# Patient Record
Sex: Female | Born: 1980 | Race: White | Hispanic: No | Marital: Single
Health system: Midwestern US, Community
[De-identification: ages and names within clinical notes are randomized; demographics above are authoritative.]

## PROBLEM LIST (undated history)

## (undated) DIAGNOSIS — I10 Essential (primary) hypertension: Secondary | ICD-10-CM

## (undated) DIAGNOSIS — J45909 Unspecified asthma, uncomplicated: Secondary | ICD-10-CM

## (undated) DIAGNOSIS — M359 Systemic involvement of connective tissue, unspecified: Secondary | ICD-10-CM

## (undated) DIAGNOSIS — F32A Depression, unspecified: Secondary | ICD-10-CM

## (undated) DIAGNOSIS — K219 Gastro-esophageal reflux disease without esophagitis: Secondary | ICD-10-CM

## (undated) DIAGNOSIS — M069 Rheumatoid arthritis, unspecified: Secondary | ICD-10-CM

## (undated) DIAGNOSIS — M1990 Unspecified osteoarthritis, unspecified site: Secondary | ICD-10-CM

## (undated) DIAGNOSIS — F329 Major depressive disorder, single episode, unspecified: Secondary | ICD-10-CM

## (undated) DIAGNOSIS — T7840XA Allergy, unspecified, initial encounter: Secondary | ICD-10-CM

## (undated) DIAGNOSIS — G709 Myoneural disorder, unspecified: Secondary | ICD-10-CM

## (undated) HISTORY — DX: Rheumatoid arthritis, unspecified: M06.9

## (undated) HISTORY — DX: Gastro-esophageal reflux disease without esophagitis: K21.9

## (undated) HISTORY — DX: Unspecified asthma, uncomplicated: J45.909

## (undated) HISTORY — DX: Major depressive disorder, single episode, unspecified: F32.9

## (undated) HISTORY — DX: Myoneural disorder, unspecified: G70.9

## (undated) HISTORY — DX: Allergy, unspecified, initial encounter: T78.40XA

## (undated) HISTORY — DX: Essential (primary) hypertension: I10

## (undated) HISTORY — DX: Unspecified osteoarthritis, unspecified site: M19.90

## (undated) HISTORY — DX: Depression, unspecified: F32.A

---

## 2008-12-14 DIAGNOSIS — G709 Myoneural disorder, unspecified: Secondary | ICD-10-CM

## 2008-12-14 HISTORY — DX: Myoneural disorder, unspecified: G70.9

## 2015-01-07 NOTE — Telephone Encounter (Signed)
S: Pt is calling [facility] with constant left pelvic pain.  B: Pt has had symptoms x 4 days  A: Pt reports that she has had sudden onset of moderate to severe left pelvic pain that has been constant x 4 days. She states that sitting and bending aggravates that pain. Laying flat and pain meds have given her temporary relief. Pt denies having vomiting, constipation, diarrhea, urinary symptoms. Pt is complaining of occasional nausea. Pt states that the pain worsens when her bladder is full or after she has eaten a meal. Pt states that she does not have periods since she has had an implant.   R:DISPOSITION: See Physician within 4 Hours (or Physician triage) - [1] MILD-MODERATE pain AND [2] constant AND [3] present > 2 hours. Dr Risa GrillShondel paged and notified of pt symptoms and pt refusal to go to ER at this time. Pt requesting an appt for tomorrow. Appt made for 1/26 @ 11:00 AM [facility] office. Pt advised to call back or seek medical assistance if her symptoms worsen.

## 2015-01-08 ENCOUNTER — Ambulatory Visit
Admit: 2015-01-08 | Discharge: 2015-01-08 | Payer: BLUE CROSS/BLUE SHIELD | Attending: Advanced Practice Midwife | Primary: Internal Medicine

## 2015-01-08 DIAGNOSIS — R102 Pelvic and perineal pain: Secondary | ICD-10-CM

## 2015-01-08 NOTE — Progress Notes (Signed)
Amanda DankerBarbara A [name]  01/08/2015    Date Of Birth:  12/16/1980      Chief Complaint   Patient presents with   ??? Pelvic Pain            HPI:  Amanda [name] is a 34 y.o. femaleThe patient was seen today. She is here regarding pelvic pain.  For the past 4 days she has had pain in left lower part of her abdomen in her pelvis.  The pain is a constant ache, but it gets sharp and stabbing on occasion with movement.  Not bad enough to go to the ED, but uncomfortable.  She has had implanon/nexplanon for 2 years.  Usually no bleeding, on occasion has some spotting.  Sexually active with her boyfriend, monogamous, declined GC/CT testing.  No abnormal discharge.  Had a yeast infection that resolved after probiotics.  Her bowels are regular and she is voiding without difficulty.  No constipation or diarrhea.      Obstetric History    G1   P0   T0   P0   A1   TAB0   SAB1   E0   M0   L0       # Outcome Date GA Lbr Len/2nd Weight Sex Delivery Anes PTL Lv   1 SAB                   Past Medical History   Diagnosis Date   ??? Fibromyalgia    ??? Missed ab    ??? Eczema    ??? Rheumatoid arthritis (HCC)    ??? Depression        History reviewed. No pertinent past surgical history.    Family History   Problem Relation Age of Onset   ??? Heart Disease Mother    ??? Heart Disease Father    ??? Cancer Father      skin       History     Social History   ??? Marital Status: Single     Spouse Name: N/A     Number of Children: N/A   ??? Years of Education: N/A     Occupational History   ??? Not on file.     Social History Main Topics   ??? Smoking status: Former Smoker   ??? Smokeless tobacco: Never Used   ??? Alcohol Use: Yes   ??? Drug Use: No   ??? Sexual Activity:     Partners: Male     Other Topics Concern   ??? Not on file     Social History Narrative   ??? No narrative on file         MEDICATIONS:  Current Outpatient Prescriptions   Medication Sig Dispense Refill   ??? DULoxetine (CYMBALTA) 60 MG capsule Take 60 mg by mouth 2 times daily     ??? naproxen (NAPROSYN) 500 MG tablet  Take 500 mg by mouth 2 times daily (with meals)     ??? etonogestrel (NEXPLANON) 68 MG implant Inject 68 mg into the skin once     ??? topiramate (TOPAMAX) 50 MG tablet Take 50 mg by mouth 2 times daily     ??? tiZANidine (ZANAFLEX) 2 MG tablet Take 4 mg by mouth every 6 hours as needed     ??? tocilizumab (ACTEMRA) 200 MG/10ML SOLN Infuse intravenously every 7 days       No current facility-administered medications for this visit.  ALLERGIES:  Allergies as of 01/08/2015   ??? (No Known Allergies)       PERTINENT POSITIVES SEE HPI    BP 143/86 mmHg   Ht  (1.702 m)   Wt 204 lb (92.534 kg)   BMI 31.94 kg/m2     General: A&O x3 NAD  Eyes: PERRLA normal sclera  Neck: Supple  Neuro: no gross motor deficits  Extremities: No calf tenderness, DTR 2+, and No edema bilaterally  Psych: normal affect and mood    Pelvic: Vulva and vagina appear normal. Bimanual exam reveals normal uterus and adnexa, tender on left LLQ.     Diagnostics:    No results found.     Lab Results:  No results found for this or any previous visit.      Amanda [name] was seen today for pelvic pain.    Diagnoses and associated orders for this visit:    Pelvic pain in female  - [place] Non OB Transvaginal; Future           No Follow-up on file.    U/S scheduled ASAP with doc appt after to review results.  Pt to call or go to ED with severe pain or worsening symptoms.    Pt to follow up with PCP for elevated BP.    Patient was seen with total face to face time of 15 minutes. More than 50% of this visit was counseling and education regarding The encounter diagnosis was Pelvic pain in female. and Pelvic Pain   as well as  counseling on preventative health maintenance follow-up.

## 2015-01-09 ENCOUNTER — Ambulatory Visit: Admit: 2015-01-09 | Discharge: 2015-01-09 | Payer: BLUE CROSS/BLUE SHIELD | Primary: Internal Medicine

## 2015-01-09 DIAGNOSIS — R102 Pelvic and perineal pain: Secondary | ICD-10-CM

## 2015-01-10 ENCOUNTER — Ambulatory Visit
Admit: 2015-01-10 | Discharge: 2015-01-10 | Payer: BLUE CROSS/BLUE SHIELD | Attending: Obstetrics & Gynecology | Primary: Internal Medicine

## 2015-01-10 DIAGNOSIS — N838 Other noninflammatory disorders of ovary, fallopian tube and broad ligament: Secondary | ICD-10-CM

## 2015-01-10 NOTE — Progress Notes (Signed)
Chief Complaint   Patient presents with   ??? Consultation     Discuss [place]       No LMP recorded. Patient has had an implant.     History:    Past Medical History   Diagnosis Date   ??? Fibromyalgia    ??? Missed ab    ??? Eczema    ??? Rheumatoid arthritis (HCC)    ??? Depression        History reviewed. No pertinent past surgical history.    Family History   Problem Relation Age of Onset   ??? Heart Disease Mother    ??? Heart Disease Father    ??? Cancer Father      skin       History     Social History   ??? Marital Status: Single     Spouse Name: N/A     Number of Children: N/A   ??? Years of Education: N/A     Social History Main Topics   ??? Smoking status: Former Smoker   ??? Smokeless tobacco: Never Used   ??? Alcohol Use: 0.0 oz/week     0 Not specified per week   ??? Drug Use: No   ??? Sexual Activity:     Partners: Male     Birth Control/ Protection: Implant      Comment: Nexplanon     Other Topics Concern   ??? None     Social History Narrative       Allergies:    No Known Allergies    Medications:    Current Outpatient Prescriptions on File Prior to Visit   Medication Sig Dispense Refill   ??? DULoxetine (CYMBALTA) 60 MG capsule Take 60 mg by mouth 2 times daily     ??? naproxen (NAPROSYN) 500 MG tablet Take 500 mg by mouth 2 times daily (with meals)     ??? etonogestrel (NEXPLANON) 68 MG implant Inject 68 mg into the skin once     ??? topiramate (TOPAMAX) 50 MG tablet Take 50 mg by mouth 2 times daily     ??? tiZANidine (ZANAFLEX) 2 MG tablet Take 4 mg by mouth every 6 hours as needed     ??? tocilizumab (ACTEMRA) 200 MG/10ML SOLN Infuse intravenously every 7 days       No current facility-administered medications on file prior to visit.       HPI:    HPI The patient was seen today. She is here regarding pelvic pain. And to discuss her ultrasound which shows a 7cm complex ovarian mass (?dermoid).  For the past 7 days she has had pain in left lower part of her abdomen in her pelvis. The pain is a constant ache, but it gets sharp and stabbing on  occasion with movement. Not bad enough to go to the ED, but uncomfortable. She has had implanon/nexplanon for 2 years. Usually no bleeding, on occasion has some spotting. Sexually active with her boyfriend, monogamous, declined GC/CT testing. No abnormal discharge. Had a yeast infection that resolved after probiotics. Her bowels are regular and she is voiding without difficulty. No constipation or diarrhea.    ROS:    Review of Systems    Physical exam:    Physical Exam    Assessment and Plan:    Assessment   1. Ovarian mass, left  CA 125    CEA    [facility] [place] - Jaynie Crumble, MD   2. Fibroids, subserous  3. Pelvic pain in female  [facility] [place] Jaynie Crumbleobin Laskey, MD         Plan I spent 25min face to face and more than 51% of the  time was in consultation about ovarian cysts, tumors and fibroids. She will be referrred to gyn/oncology

## 2015-01-10 NOTE — Patient Instructions (Signed)
Functional Ovarian Cyst: Care Instructions  Your Care Instructions     A functional ovarian cyst is a sac that forms on the surface of a woman's ovary during ovulation. The sac holds a maturing egg. Usually the sac goes away after the egg is released. But if the egg is not released, or if the sac closes up after the egg is released, the sac can swell up with fluid and form a cyst.  Functional ovarian cysts are different than ovarian growths caused by other problems, such as cancer. Most functional ovarian cysts cause no symptoms and go away on their own. Some cause mild pain. Others can cause severe pain when they rupture or bleed.  Follow-up care is a key part of your treatment and safety. Be sure to make and go to all appointments, and call your doctor if you are having problems. It's also a good idea to know your test results and keep a list of the medicines you take.  How can you care for yourself at home?  ?? Use heat, such as a hot water bottle, a heating pad set on low, or a warm bath, to relax tense muscles and relieve cramping.  ?? Take pain medicines exactly as directed.  ?? If the doctor gave you a prescription medicine for pain, take it as prescribed.  ?? If you are not taking a prescription pain medicine, ask your doctor if you can take an over-the-counter medicine.  ?? Avoid constipation. Make sure you drink enough fluids and include fruits, vegetables, and fiber in your diet each day. Constipation does not cause ovarian cysts, but it may make your pelvic pain worse.  When should you call for help?  Call 911 anytime you think you may need emergency care. For example, call if:  ?? You passed out (lost consciousness).  ?? You have sudden, severe pain in your belly or your pelvis.  Call your doctor now or seek immediate medical care if:  ?? You have new belly or pelvic pain, or your pain gets worse.  ?? You have severe vaginal bleeding. This means that you are soaking through your usual pads or tampons each hour  for 2 or more hours.  ?? You are dizzy or lightheaded, or you feel like you may faint.  ?? You have pain or bleeding during or after sex.  Watch closely for changes in your health, and be sure to contact your doctor if:  ?? Your pain keeps you from doing the things that you enjoy.  ?? You do not get better as expected.   Where can you learn more?   Go to [email or web address] and sign in to your MyChart account. Enter I547 in the Search Health Information box to learn more about ???Functional Ovarian Cyst: Care Instructions.???    If you do not have an account, please click on the ???Sign Up Now??? link.     ?? 2006-2015 [employer]. Care instructions adapted under license by [facility]. This care instruction is for use with your licensed healthcare professional. If you have questions about a medical condition or this instruction, always ask your healthcare professional. Healthwise, Incorporated disclaims any warranty or liability for your use of this information.  Content Version: 10.6.465758; Current as of: February 02, 2014              Uterine Fibroids: Care Instructions  Your Care Instructions     Uterine fibroids are growths in the uterus. Fibroids aren't cancer. Doctors don't know what   causes fibroids. Fibroids are very common in women during their childbearing years.  Fibroids can grow on the inside of the uterus, in the muscle wall of the uterus, or near the outside wall of the uterus. In some women, fibroids cause painful cramps and heavy periods. In these cases, taking anti-inflammatory medicines and birth control pills often helps decrease symptoms. Sometimes surgery is needed to treat fibroids. But if you are near menopause, you may want to wait and see if your symptoms get better.  Most fibroids shrink and go away after menopause, when your menstrual periods stop completely.  Follow-up care is a key part of your treatment and safety. Be sure to make and go to all appointments, and  call your doctor if you are having problems. It's also a good idea to know your test results and keep a list of the medicines you take.  How can you care for yourself at home?  ?? If your doctor gave you medicine, take it as exactly as prescribed. Call your doctor if you think you are having a problem with your medicine.  ?? Take anti-inflammatory medicines for pain. These include ibuprofen (Advil, Motrin) and naproxen (Aleve). Read and follow all instructions on the label.  ?? Use heat, such as a hot water bottle or a heating pad set on low, or a warm bath to relax tense muscles and relieve cramping. Put a thin cloth between the heating pad and your skin. Never go to sleep with a heating pad on.  ?? Lie down and put a pillow under your knees. Or, lie on your side and bring your knees up to your chest. These positions may help relieve belly pain or pressure.  ?? Use sanitary pads instead of tampons. Keep track of how many you use each day.  ?? Get at least 30 minutes of exercise on most days of the week. Walking is a good choice. You also may want to do other activities, such as running, swimming, cycling, or playing tennis or team sports.  ?? If you bleed for several days or have heavy bleeding, take a daily multivitamin with iron.  When should you call for help?  Call 911 anytime you think you may need emergency care. For example, call if:  ?? You passed out (lost consciousness).  ?? You have sudden, severe pain in your belly or pelvis.  Call your doctor now or seek immediate medical care if:  ?? You have severe vaginal bleeding. This means that you are soaking through your usual pads each hour for 2 or more hours.  ?? You have new belly or pelvic pain.  ?? You are dizzy or lightheaded, or you feel like you may faint.  ?? You have new or unexpected vaginal bleeding.  Watch closely for changes in your health, and be sure to contact your doctor if:  ?? You have new vaginal discharge.  ?? You have ongoing heavy or irregular vaginal  bleeding.  ?? You have pelvic pain or a heavy feeling in your lower belly that doesn't go away.  ?? You have to urinate often.  ?? You are more constipated than usual.   Where can you learn more?   Go to [email or web address] and sign in to your MyChart account. Enter B121 in the Search Health Information box to learn more about ???Uterine Fibroids: Care Instructions.???    If you do not have an account, please click on the ???Sign Up Now??? link.     ?? 2006-2015   Healthwise, Inc[employer]. Care instructions adapted under license by [facility]. This care instruction is for use with your licensed healthcare professional. If you have questions about a medical condition or this instruction, always ask your healthcare professional. Healthwise, Incorporated disclaims any warranty or liability for your use of this information.  Content Version: 10.6.465758; Current as of: February 02, 2014

## 2015-01-11 LAB — CEA: CEA: 0.7 nG/mL (ref 0.0–5.0)

## 2015-01-11 LAB — CA 125: CA 125: 10.5 U/mL (ref 0.0–30.2)

## 2015-01-14 HISTORY — PX: LAPAROSCOPIC OOPHERECTOMY: SHX6507

## 2015-01-14 NOTE — Telephone Encounter (Signed)
-----   Message from Margie BilletSue E Espinal, MD sent at 01/12/2015  4:02 AM EST -----  Please let pt know her tumor markers are in the normal range

## 2015-01-14 NOTE — Telephone Encounter (Signed)
Rev'd with pt.

## 2015-01-15 ENCOUNTER — Ambulatory Visit
Admit: 2015-01-15 | Discharge: 2015-01-15 | Payer: BLUE CROSS/BLUE SHIELD | Attending: Gynecologic Oncology | Primary: Internal Medicine

## 2015-01-15 DIAGNOSIS — N9489 Other specified conditions associated with female genital organs and menstrual cycle: Secondary | ICD-10-CM

## 2015-01-15 NOTE — Telephone Encounter (Signed)
Patient spoke with her pain management physician - he told her to just bring to their office any meds prescribed by our office.  Her rheumatologist had her stop Actemra today and will not restart until after her surgery.  She also wanted to know that if Dr Caralee AtesAndrews found a large cyst on the other ovary would he remove that also. I told her yes.

## 2015-01-15 NOTE — Progress Notes (Signed)
Chief Complaint   Patient presents with   ??? Established New Doctor     abnormal ultrasound ref [facility]   ??? Abdominal Pain   ??? Nausea   ??? Urinary Frequency       HISTORY OF THE PRESENT ILLNESS:  Amanda [name] is a pleasant 34 y.o. female who presents for evaluation and management of the above.    Saw Dr. Octavio Graves, ultrasound which shows a 6.9 cm complex ovarian mass (?dermoid). I reviewed the report.  For the past ~ 2 weeks  she has had pain in left lower part of her abdomen in her pelvis. The pain is a constant ache, but it gets sharp and stabbing on occasion with movement. She has had implanon/nexplanon for 2 years.  Desires future fertility.      CA125 10.5, 01/10/2015    Past Medical History   Diagnosis Date   ??? Fibromyalgia    ??? Missed ab    ??? Eczema    ??? Rheumatoid arthritis (HCC)    ??? Depression    ??? Birth control      nexplanon 01/17/2013 [facility]        History reviewed. No pertinent past surgical history.    Gynecologic History:  No LMP recorded. Patient has had an implant.    No results found for: PAP      Obstetrical History:  [id number]    Family History   Problem Relation Age of Onset   ??? Heart Disease Mother    ??? Heart Disease Father    ??? Cancer Father      skin       History     Social History   ??? Marital Status: Single     Spouse Name: N/A     Number of Children: N/A   ??? Years of Education: N/A     Occupational History   ??? Not on file.     Social History Main Topics   ??? Smoking status: Former Smoker   ??? Smokeless tobacco: Never Used   ??? Alcohol Use: 0.0 oz/week     0 Not specified per week   ??? Drug Use: No   ??? Sexual Activity:     Partners: Male     Birth Control/ Protection: Implant      Comment: Nexplanon     Other Topics Concern   ??? Not on file     Social History Narrative       Current Outpatient Prescriptions on File Prior to Visit   Medication Sig Dispense Refill   ??? DULoxetine (CYMBALTA) 60 MG capsule Take 60 mg by mouth 2 times daily     ??? naproxen (NAPROSYN) 500 MG tablet Take 500 mg by  mouth 2 times daily (with meals)     ??? etonogestrel (NEXPLANON) 68 MG implant Inject 68 mg into the skin once Indications: start date 01/17/13 end date 01/18/2016 [facility]      ??? topiramate (TOPAMAX) 50 MG tablet Take 50 mg by mouth 2 times daily     ??? tiZANidine (ZANAFLEX) 2 MG tablet Take 4 mg by mouth every 6 hours as needed     ??? tocilizumab (ACTEMRA) 200 MG/10ML SOLN Infuse intravenously every 7 days       No current facility-administered medications on file prior to visit.       Allergies as of 01/15/2015 - Review Complete 01/15/2015   Allergen Reaction Noted   ??? Rituxan [rituximab] Other (See Comments) 01/15/2015  Review of Systems   Constitutional: Positive for activity change. Negative for fatigue.   Gastrointestinal: Positive for abdominal pain.   Genitourinary: Positive for pelvic pain. Negative for vaginal discharge, difficulty urinating and vaginal pain.   Skin: Negative for color change.   Psychiatric/Behavioral: Negative for behavioral problems.       Physical Exam   Constitutional: She is oriented to person, place, and time. She appears well-developed and well-nourished.   HENT:   Head: Normocephalic.   Neck: Normal range of motion.   Pulmonary/Chest: Effort normal.   Abdominal: Soft. She exhibits no distension and no mass. There is tenderness. There is no rebound and no guarding.   Genitourinary: Vagina normal and uterus normal.   Musculoskeletal: Normal range of motion.   Neurological: She is alert and oriented to person, place, and time.   Skin: Skin is warm and dry.   Psychiatric: She has a normal mood and affect. Her behavior is normal. Judgment and thought content normal.   Rectal exam negative    I counsled her on the risks, benefits, alternatives to surgery.  Goal MIS, cystectomy.  Probably will need ovary removed.  Possible open.  We discussed the differential diagnosis with her and her significant other.   ASSESSMENT/PLAN:  [name] was seen today for established new doctor, abdominal  pain, nausea and urinary frequency.    Diagnoses and associated orders for this visit:    Adnexal mass    Pelvic pain in female        Return for Laparoscopic Left cystectomy, possible oophorectomy, poss open; Pt wants ASAP, 1st avail any gyn onc.    The patient had an opportunity to ask questions, all of which were answered to the best of my ability.  She is in agreement with the above noted plan.      >51% of the 30 min visit was spent in direct face to face counseling and coordination of care.

## 2015-01-15 NOTE — Telephone Encounter (Signed)
Pt called in regards to her surgery that is scheduled on 01/22/15. Pt states that she has some questions about the surgery and would like you to call her back at [phone]

## 2015-01-15 NOTE — Telephone Encounter (Signed)
Date   Completed [facility] Pre-Surgery   01/15/15 Case Number [id number]   01/15/15 Surgery Date/Time  Surgeon  Time needed  Outpatient  Outpatient with bed  Inpatient  Anesthesia  General/MAC/TAP/Other  Combo surgery with  Additional procedure 01/22/15 10:00  Andrews  1.0  Out patient   General   Lap L Ovarian cystectomy / poss open/ Poss L Oophorectomy   01/15/15 PAT Date/Time (Appt / Phone call  Order/H&P faxed to PAT [phone] Phone call 01/21/15  9:40am   01/15/15 Patient Notified of Dates and times  Post op appt scheduled 02/04/15  3:15pm   01/15/15 Surgery Booklet/Instructions Given to Patient  [x]  At Visit   []  Mailed to Home    Additional Info Needed  Cardiac testing/Clearance  Sleep Study Info/CPAP Info  Anticoagulant instructions    []  Faxed  []  RCV???D  []  SENT TO PAT   []  Faxed  []  RCV???D  []  SENT TO PAT   []  Faxed  []  RCV???D  []  SENT TO PAT    Surgery scheduled in computer x 2     Medicaid form signed for all hysterectomy,  BSO, ablation for Medicaid pts only    01/15/15 Eligible for Lymphedema/Cervical Study    01/15/15 Pre-Certification  Insurance [employer]  Phone Number [phone]  Policy [id number]  Codes [id number]  Diagnosis ICD-10 Code N94.9  Description Adnexal mass  [x]  Completed     []  Faxed   [x]  Reference # [id number]  Not required     For Physicians to Check:     Bowel Prep     Lovenox Injections Post-Op     Reviewed Meds with Pt     Pre-Op Clearance  []  Received/Faxed Clearance    Coordinated Case with 2nd Physician

## 2015-01-22 LAB — PREGNANCY, URINE: HCG Urine: NEGATIVE

## 2015-01-22 NOTE — Op Note (Signed)
PATIENT:             Amanda [name], Amanda [name]      SURGERY DATE:         01/22/2015  MEDICAL RECORD NUMBER:     [id number]            ADMISSION DATE:       01/22/2015  ACCOUNT #:           [id number]           ADMITTING:            Maylon PeppersStephen   Jearld Hemp, MD  DATE OF BIRTH:       [birth year]             SURGEON:              Maylon PeppersStephen   Vanisha Whiten, MD  AGE:                 33                     HOSPITAL SERVICE:                                  Electronically Authenticated                             Maylon PeppersStephen Keri Veale, MD 01/31/2015 02:39 P      Procedure:  LAPAROSCOPIC LSO.    Preoperative Diagnoses:  1. Left lower quadrant mass.  2. Left lower quadrant pain.    Postoperative Diagnosis:  Left ovarian dermoid.    Anesthesia:  General.    Assistant:  Dr. Frederich ChickMeghan Cesta.    Description Of Findings:  The uterus right tube and ovary was normal.  The upper abdomen was normal.  The left ovary was replaced by [name] large  mass.  Normal identifiable very tissue was found.  It was opened off  the field and contained here with sebaceous material consistent with [name]  mature dermoid.    Description Of Operation:  The patient identified, brought to the  operating room.  After administration of general anesthesia and [name] tap  block, she was placed in [name] low lithotomy position using the Yellofin  stirrups.  The legs were very carefully placed to avoid any nerve  injury, she underwent abdominoperineal vaginal prep.  Exam under  anesthesia did confirm [name] mass in the left lower quadrant.  She was  prepped and draped in the low lithotomy position.  Time-out was  performed.  [name] small manipulator was placed in the uterus and [name] Foley  catheter was placed in the bladder.  [name] 10 mm incision was made at the  umbilicus and using the direct technique and the Visiport, the abdomen  was entered under direct vision.  Two 5 mm ports were placed in the  right left lower quadrant, all under direct vision.  The left ovarian  artery and vein were skeletonized above the ureter,  coagulated with  bipolar cautery and divided as was the broad ligament, proximal  fallopian tube and ovarian ligament.  This was after careful  inspection of the ovary revealed no area that cystectomy could be  performed.  The mass was placed in [name] bag, the umbilical incision was  opened slightly larger and this allowed the ovary to be drained  somewhat in the bag and removed out through the  umbilical incision.  The umbilical incision was closed using [name] running 0 Vicryl.  The  fascia, the other trocars removed and the skin incisions were all  closed with subcuticular 4-0 Monocryl and Dermabond.  EBL for the  entire procedure was only about 50 mL.  All instruments removed from  the bladder and the vagina and the uterus, and she was taken to the  recovery room in stable condition with all counts reported as correct.      cc:  Dr. Terrence Dupont    Diskriter Job ID: [id number]        Maylon Peppers, MD    DOD:01/22/2015 10:08 [name]  SA/dsk  DOT:01/22/2015 05:08 P  Job Number: [id number]  Document Number: [id number]  cc:   Maylon Peppers, MD        [facility]        [id number]        [place] [address]

## 2015-01-23 NOTE — Telephone Encounter (Signed)
Pt called with benign path

## 2015-01-29 NOTE — Telephone Encounter (Signed)
Pt called today with PO concern of right of belly button feeling some pulling-inside-feels like she is taking off a bandaid.  Surgery on 01/22/15 with Dr. Caralee Ates.  Please call her back today if possible

## 2015-01-29 NOTE — Telephone Encounter (Signed)
Pt called and no answer.  Left message to call back if needed.

## 2015-01-29 NOTE — Telephone Encounter (Signed)
Do you want to talk to this patient since it's a post op concern?

## 2015-02-04 ENCOUNTER — Ambulatory Visit
Admit: 2015-02-04 | Discharge: 2015-02-04 | Payer: BLUE CROSS/BLUE SHIELD | Attending: Gynecologic Oncology | Primary: Internal Medicine

## 2015-02-04 DIAGNOSIS — Z9889 Other specified postprocedural states: Secondary | ICD-10-CM

## 2015-02-04 NOTE — Progress Notes (Signed)
CC: patient here for post-op visit s/p laparoscopic LSO and to discuss her pathology report.    Pathology:  DIAGNOSIS:    OVARY AND TUBE, LEFT, SALPINGO-OOPHORECTOMY:    OVARIAN MATURE CYSTIC TERATOMA (DERMOID CYST).  UNREMARKABLE FALLOPIAN TUBE.    BP 127/86 mmHg  Pulse 97  Temp(Src) 98.2 F (36.8 C) (Oral)  Ht 5\' 8"  (1.727 m)  Wt 202 lb (91.627 kg)  BMI 30.72 kg/m2  SpO2 98%      Physical Exam     Abd: well healed.  Soft        Assessment: s/p laparoscopic LSO    Plan: f/u Dr Nathanial MillmanEspinal

## 2015-03-01 NOTE — Telephone Encounter (Signed)
S: Pt is calling [facility] with vaginal bleeding  B: Pt has had vaginal bleeding x 9 days.  Pt had surg on 08/22/14 left ovary removed   A: Pt states that she has had mild vaginal bleeding < 1 pad/hour. She states that she initially had cramping last week but no cramping or abdominal pain at this time. Pt states that she is passing small pea sized clots and denies passing any tissue. Pt has an implanon and states that it is rare if she has menses and has not had a period in 6 months or more. Pt denies having fever, weakness or lightheadedness. Pt does have cold symptoms at this time. Pt denies having change in bowel or bladder habits.   R: DISPOSITION: See Physician When Office is Open (within 3 days) - [1] Periods with > 6 soaked pads or tampons per day AND [2] last > 7 days. Appt made for 03/04/15 @ 9:00 AM. Pt advised to call back if her symptoms worsen.     TE sent

## 2015-03-04 ENCOUNTER — Ambulatory Visit
Admit: 2015-03-04 | Discharge: 2015-03-04 | Payer: BLUE CROSS/BLUE SHIELD | Attending: Obstetrics & Gynecology | Primary: Internal Medicine

## 2015-03-04 DIAGNOSIS — N938 Other specified abnormal uterine and vaginal bleeding: Secondary | ICD-10-CM

## 2015-03-04 MED ORDER — NORETHINDRONE 0.35 MG PO TABS
0.35 MG | ORAL_TABLET | Freq: Every day | ORAL | Status: DC
Start: 2015-03-04 — End: 2015-04-11

## 2015-03-04 NOTE — Progress Notes (Signed)
Chief Complaint   Patient presents with   ??? Vaginal Bleeding     irregular bleeding on nexplanon and since surgery 01/22/15       Patient's last menstrual period was 02/20/2015.     History:    Past Medical History   Diagnosis Date   ??? Fibromyalgia    ??? Missed ab    ??? Eczema    ??? Rheumatoid arthritis (HCC)    ??? Depression    ??? Birth control      nexplanon 01/17/2013 paragon        Past Surgical History   Procedure Laterality Date   ??? Ovarian cyst removal Left 01/22/15     dermoid/LSO       Family History   Problem Relation Age of Onset   ??? Heart Disease Mother    ??? Heart Disease Father    ??? Cancer Father      skin       History     Social History   ??? Marital Status: Single     Spouse Name: N/A     Number of Children: N/A   ??? Years of Education: N/A     Social History Main Topics   ??? Smoking status: Former Smoker   ??? Smokeless tobacco: Never Used   ??? Alcohol Use: 0.0 oz/week     0 Not specified per week   ??? Drug Use: No   ??? Sexual Activity:     Partners: Male     Birth Control/ Protection: Implant      Comment: Nexplanon     Other Topics Concern   ??? None     Social History Narrative       Allergies:    Allergies   Allergen Reactions   ??? Rituxan [Rituximab] Other (See Comments)     Closes up windpipe       Medications:    Current Outpatient Prescriptions on File Prior to Visit   Medication Sig Dispense Refill   ??? DULoxetine (CYMBALTA) 60 MG capsule Take 60 mg by mouth 2 times daily     ??? naproxen (NAPROSYN) 500 MG tablet Take 500 mg by mouth 2 times daily (with meals)     ??? etonogestrel (NEXPLANON) 68 MG implant Inject 68 mg into the skin once Indications: start date 01/17/13 end date 01/18/2016 [facility]      ??? topiramate (TOPAMAX) 50 MG tablet Take 50 mg by mouth 2 times daily     ??? tiZANidine (ZANAFLEX) 2 MG tablet Take 4 mg by mouth every 6 hours as needed     ??? tocilizumab (ACTEMRA) 200 MG/10ML SOLN Infuse intravenously every 7 days       No current facility-administered medications on file prior to visit.        HPI:    HPI she is s/p LSO due to a 6.9cm teratoma. She had nexplanon placed 2 years ago and had no menses but since her surgery on 01/22/15 she has had bleeding daily with occassional pains on her right side. She states the bleeding is bright red and changes a pad every 4 hours and sometimes there was clots. The  Bleeding is lighter but still red in color. She also c/o  Pressure sensation and points to her suprapubic area    ROS:    Review of Systems   Constitutional: Negative for fever and chills.   Gastrointestinal: Negative for diarrhea, constipation, blood in stool and abdominal distention.   Endocrine: Negative for  cold intolerance and heat intolerance.   Genitourinary: Positive for vaginal bleeding. Negative for frequency, difficulty urinating and vaginal pain.       Physical exam:    Physical Exam   Constitutional: She is oriented to person, place, and time. She appears well-developed and well-nourished.   Abdominal: She exhibits no distension and no mass. There is no tenderness. There is no rebound and no guarding.   Genitourinary: Vagina normal and uterus normal.   Dark brown bld in vault- small amount   Neurological: She is alert and oriented to person, place, and time.   Skin: Skin is warm and dry.   Psychiatric: She has a normal mood and affect. Her behavior is normal.       Assessment and Plan:    Assessment   1. DUB (dysfunctional uterine bleeding)     2. Pelvic pressure in female  Urine Culture         Plan we discussed that her hormones are effected by the recent surgery. We discussed trying nor qd for a month or two to see if it helps regulate her menses.    She is to return for her annual sooner if her symptoms persist

## 2015-03-05 LAB — CULTURE, URINE

## 2015-03-06 NOTE — Telephone Encounter (Signed)
LMTCO #1

## 2015-03-06 NOTE — Telephone Encounter (Signed)
-----   Message from Margie BilletSue E Espinal, MD sent at 03/05/2015  6:35 PM EDT -----  Please let pt know her urine culture is negative

## 2015-03-07 NOTE — Telephone Encounter (Signed)
Pt calling back.  SE's comments given.  Verbalized understanding.

## 2015-04-11 ENCOUNTER — Ambulatory Visit
Admit: 2015-04-11 | Discharge: 2015-04-11 | Payer: BLUE CROSS/BLUE SHIELD | Attending: Obstetrics & Gynecology | Primary: Internal Medicine

## 2015-04-11 DIAGNOSIS — Z01419 Encounter for gynecological examination (general) (routine) without abnormal findings: Secondary | ICD-10-CM

## 2015-04-11 NOTE — Progress Notes (Signed)
Amanda [name]  04/11/2015              33 y.o.            Primary Care Physician: Helayne Seminole  Chief Complaint   Patient presents with   ??? Annual Exam     HPI : Amanda [name] is a 34 y.o. female here for annual exam.  ____________________________________________________________________  Gynecologic History:    Patient's last menstrual period was 04/10/2015.  Menses are not regular. She c/o spotting every 2 weeks for the past 40m.  She is s/p left  SO  In feb 2016 Sexually Active: Yes  She has had intermittent spotting since the nexplanon but it is light  STD History: No    Reversible Birth Control:Yes: nexplanon placed feb 2014    HPV vaccination completed: No        Preventative Health Testing:  Date of Last Pap Smear:   Abnormal Pap Smear History: yes when she was 34 yrs old   Colposcopy History:     Obstetric History    G1   P0   T0   P0   A1   TAB0   SAB1   E0   M0   L0       # Outcome Date GA Lbr Len/2nd Weight Sex Delivery Anes PTL Lv   1 SAB                 Past Medical History   Diagnosis Date   ??? Fibromyalgia    ??? Missed ab    ??? Eczema    ??? Rheumatoid arthritis (HCC)    ??? Depression    ??? Birth control      nexplanon 01/17/2013 paragon                                                                    Past Surgical History   Procedure Laterality Date   ??? Ovarian cyst removal Left 01/22/15     dermoid/LSO     Family History   Problem Relation Age of Onset   ??? Heart Disease Mother    ??? Heart Disease Father    ??? Cancer Father      skin     History     Social History   ??? Marital Status: Single     Spouse Name: N/A     Number of Children: N/A   ??? Years of Education: N/A     Occupational History   ??? Not on file.     Social History Main Topics   ??? Smoking status: Former Smoker   ??? Smokeless tobacco: Never Used   ??? Alcohol Use: 0.0 oz/week     0 Not specified per week   ??? Drug Use: No   ??? Sexual Activity:     Partners: Male     Birth Control/ Protection: Implant      Comment: Nexplanon Inserted 01/17/2013      Other Topics Concern   ??? Not on file     Social History Narrative       MEDICATIONS:  Current Outpatient Prescriptions   Medication Sig Dispense Refill   ??? ACTEMRA 162 MG/0.9ML SOSY      ???  tiZANidine (ZANAFLEX) 4 MG tablet      ??? clobetasol (TEMOVATE) 0.05 % ointment      ??? PROAIR HFA 108 (90 BASE) MCG/ACT inhaler      ??? omeprazole (PRILOSEC) 20 MG capsule      ??? NUCYNTA 50 MG TABS      ??? DULoxetine (CYMBALTA) 60 MG capsule Take 60 mg by mouth 2 times daily     ??? naproxen (NAPROSYN) 500 MG tablet Take 500 mg by mouth 2 times daily (with meals)     ??? etonogestrel (NEXPLANON) 68 MG implant Inject 68 mg into the skin once Indications: start date 01/17/13 end date 01/18/2016 [facility]      ??? topiramate (TOPAMAX) 50 MG tablet Take 50 mg by mouth 2 times daily       No current facility-administered medications for this visit.       ALLERGIES:  Allergies as of 04/11/2015 - Review Complete 04/11/2015   Allergen Reaction Noted   ??? Rituxan [rituximab] Other (See Comments) 01/15/2015       REVIEW OF SYSTEMS:     CONSTIUTIONAL: No fever, chills or malaise; No weight change or fatigue  CV: No Chest Pain with Exertion, Palpitations, Syncope, Edema, Arrhythmia  RESPIRATORY: No SOB, Pneumoniae,Cough,   BREAST: No breast abnormalities or lumps  GI: No Indigestion, Heartburn, Nausea, vomiting, Diarrhea, Constipation,Bloating or Bowel Changes; No Bloody Stools or melena  GU: No Dysuria, Hematuria or Nocturia. No Urinary Incontinence or Vaginal Discharge,vaginal bleeding, or dysparuenia.   NEURO: No CVA, Migraines, Epilepsy, Seizure Hx, or Limb Weakness  DERM: No Rash, Itching, Hives, Mole Changes or Cancer  PSYCH: No Depression, Homicidal thoughts,suicidal thoughts, or anxiety  MUSCULOSKELETAL: No Arthralgia or Arthritis  HEME and LYMPH :No Lymphoma, Von Willebrand's, Hemophillia or Bleeding History                                                                                                                                                                                 PHYSICAL EXAM:       Filed Vitals:    04/11/15 1234   BP: 130/85   Height:  (1.727 m)   Weight: 201 lb (91.173 kg)     Body mass index is 30.57 kg/(m^2).    GYN EXAM:   BREASTS: normal, no masses, tenderness or skin changes.   EXTERNAL GENITALIA: normal female structures  VAGINA: normal ruggae, no lesions  CERVIX: no lesions, no cervical motion tenderness, normal appearance.   UTERUS: normal mobility, nontender, normal size, shape and consistency.   ADNEXA: normal, non tender no masses.   URETHRA: normal. nontender  BLADDER: non tender.   PELVIC SUPPORT DEFECTS: Normal support of vagina,  uterus, and bladder  ANUS/PERINEUM: no hemorrhoids, masses or warts noted.     GENERAL EXAM   CONSTITUTIONAL:  Well developed, well nourished, well groomed. no acute distress  NECK: no thyromegaly, supple.   CARDIOVASCULAR: normal rate and rhythm, no edema   LUNGS: Normal effort, normal lung sounds  ABDOMEN:soft, non-tender, non-distended, no hepatospleenomegaly  NEUROLOGICAL: no gross motor or sensory deficits noted. Truddie Hidden.   MUSCULOSKETAL: normal gait, no cyanosis.   PSYCHIATRIC Normal mood and affect, A&O x3.        ASSESSMENT/PLAN:  Amanda [name] was seen today for annual exam.    Diagnoses and associated orders for this visit:    Well female exam with routine gynecological exam  - PAP SMEAR          Follow up in 1 year sooner if needed     Birth control and barrier recommendations discussed.  STD counseling and prevention reviewed.  Gardisil counseling completed for all patients 9-26 yo.  Routine health maintenance per patients PCP.    Osteoporosis is a disease process that begins in young women. Recommended daily intake is Calcium 1000mg  and Vitamin D 600IU daily to help build strong bones for later in life. Healthy diet and exercise is encouraged, goal is a normal BMI of <25. ACOG Pap smear guidelines not recommended for women under the age of 36. For women aged 36-36 years old, ACOG recommends  a pap every 3 years. Starting at age 36, it is recommended to have both a pap test and HPV test. An annual exam with pelvic exam and physician discussion is still recommended even if a pap smear is not done at each visit. Recommended screening for STDs to help ensure future fertility and decrease risk of pelvic inflammatory disease. Respect your body and protect it from STDs and pregnancy by requesting your partner to wear a condom. It is recommended to obtain the HPV vaccine for cervical cancer prevention for women up to age 10926. Be up to date on vaccines including influenza, Tetanus (DPT), Rubella, and Varicella (chicken pox).

## 2015-04-16 LAB — HPV, HIGH RISK
HPV High Risk 12 DNA: NOT DETECTED
HPV, Genotype 16: NOT DETECTED
HPV, Genotype 18: NOT DETECTED

## 2015-04-16 LAB — PAP SMEAR

## 2015-05-23 ENCOUNTER — Ambulatory Visit
Admit: 2015-05-23 | Discharge: 2015-05-23 | Payer: BLUE CROSS/BLUE SHIELD | Attending: Obstetrics & Gynecology | Primary: Internal Medicine

## 2015-05-23 DIAGNOSIS — N83201 Unspecified ovarian cyst, right side: Secondary | ICD-10-CM

## 2015-05-23 MED ORDER — NORETHIN ACE-ETH ESTRAD-FE 1-20 MG-MCG PO TABS
1-20 MG-MCG | PACK | Freq: Every day | ORAL | Status: DC
Start: 2015-05-23 — End: 2015-06-12

## 2015-05-23 NOTE — Progress Notes (Signed)
Chief Complaint   Patient presents with   ??? Gynecologic Exam     follow up tp ER visit-R ovarian cyst       Patient's last menstrual period was 05/23/2015.     History:    Past Medical History   Diagnosis Date   ??? Fibromyalgia    ??? Missed ab    ??? Eczema    ??? Rheumatoid arthritis (HCC)    ??? Depression    ??? Birth control      nexplanon 01/17/2013 paragon        Past Surgical History   Procedure Laterality Date   ??? Ovarian cyst removal Left 01/22/15     dermoid/LSO       Family History   Problem Relation Age of Onset   ??? Heart Disease Mother    ??? Heart Disease Father    ??? Cancer Father      skin       History     Social History   ??? Marital Status: Single     Spouse Name: N/A     Number of Children: N/A   ??? Years of Education: N/A     Social History Main Topics   ??? Smoking status: Former Smoker   ??? Smokeless tobacco: Never Used   ??? Alcohol Use: 0.0 oz/week     0 Not specified per week   ??? Drug Use: No   ??? Sexual Activity:     Partners: Male     Birth Control/ Protection: Implant      Comment: Nexplanon Inserted 01/17/2013     Other Topics Concern   ??? None     Social History Narrative       Allergies:    Allergies   Allergen Reactions   ??? Rituxan [Rituximab] Other (See Comments)     Closes up windpipe       Medications:    Current Outpatient Prescriptions on File Prior to Visit   Medication Sig Dispense Refill   ??? ACTEMRA 162 MG/0.9ML SOSY      ??? tiZANidine (ZANAFLEX) 4 MG tablet      ??? clobetasol (TEMOVATE) 0.05 % ointment      ??? PROAIR HFA 108 (90 BASE) MCG/ACT inhaler      ??? omeprazole (PRILOSEC) 20 MG capsule      ??? NUCYNTA 50 MG TABS      ??? DULoxetine (CYMBALTA) 60 MG capsule Take 60 mg by mouth 2 times daily     ??? naproxen (NAPROSYN) 500 MG tablet Take 500 mg by mouth 2 times daily (with meals)     ??? etonogestrel (NEXPLANON) 68 MG implant Inject 68 mg into the skin once Indications: start date 01/17/13 end date 01/18/2016 [facility]      ??? topiramate (TOPAMAX) 50 MG tablet Take 50 mg by mouth 2 times daily       No current  facility-administered medications on file prior to visit.       HPI:    HPI on June 6th she had sudden onset of suprapubic pain that was severe and lasted 20 minutes and then it would go away and then return after a few hours. She said any movement or trying to go to the bathroom brought the pain back.  The severe pain lasted from Saturday afternoon until noon on Sunday.  The pain is still present but now located on the rlq and feels more like constant pressure.  She started her menses this am.  She had a ct scan which showed a 2.7cm right ovarian cyst. She is s/p lso by dr Caralee Ates in in feb 2016 for a mature teratoma. She had a nexplanon placed 01/2013.      ROS:    Review of Systems   Constitutional: Negative for fever and chills.   Gastrointestinal: Positive for abdominal pain. Negative for diarrhea and constipation.   Genitourinary: Negative for dysuria, frequency, vaginal discharge, vaginal pain and menstrual problem.   Musculoskeletal: Positive for myalgias and arthralgias.   Skin: Negative for rash and wound.       Physical exam:    Physical Exam   Constitutional: She appears well-developed and well-nourished.   Abdominal: She exhibits no distension and no mass. There is no tenderness. There is no rebound and no guarding.   Genitourinary: Vagina normal and uterus normal. No vaginal discharge found.   Skin: Skin is warm and dry.   Psychiatric: She has a normal mood and affect. Her behavior is normal. Judgment and thought content normal.       Assessment and Plan:    Assessment  1. Right ovarian cyst  [place] Non OB Transvaginal   2. RLQ abdominal pain  [place] Non OB Transvaginal       Plan we discussed trying a low dose ocp to help prevent ovarian cysts for 3 months and then stopping it to see if her pain recurs  She will be scheduled for a pelvic ultrasound in 3 weeks with follow up with me

## 2015-05-23 NOTE — Patient Instructions (Signed)
importance of diet and exercise discussed  Risks of breast cancer and blood clots with any hormone use discussed  Perform self breast examinations  To help protect yourself from sexually transmitted infections always use condoms  Return in 1 year

## 2015-06-12 ENCOUNTER — Ambulatory Visit: Admit: 2015-06-12 | Discharge: 2015-06-12 | Payer: BLUE CROSS/BLUE SHIELD | Primary: Internal Medicine

## 2015-06-12 ENCOUNTER — Ambulatory Visit
Admit: 2015-06-12 | Discharge: 2015-06-12 | Payer: BLUE CROSS/BLUE SHIELD | Attending: Obstetrics & Gynecology | Primary: Internal Medicine

## 2015-06-12 DIAGNOSIS — N83201 Unspecified ovarian cyst, right side: Secondary | ICD-10-CM

## 2015-06-12 NOTE — Progress Notes (Signed)
Chief Complaint   Patient presents with   ??? Consultation     Review ultrasound       Patient's last menstrual period was 05/23/2015.     History:    Past Medical History   Diagnosis Date   ??? Birth control      nexplanon 01/17/2013 paragon    ??? Depression    ??? Eczema    ??? Fibromyalgia    ??? Missed ab    ??? Rheumatoid arthritis [facility])        Past Surgical History   Procedure Laterality Date   ??? Ovarian cyst removal Left 01/22/15     dermoid/LSO       Family History   Problem Relation Age of Onset   ??? Heart Disease Mother    ??? Heart Disease Father    ??? Cancer Father      skin       Social History     Social History   ??? Marital status: Single     Spouse name: N/A   ??? Number of children: N/A   ??? Years of education: N/A     Social History Main Topics   ??? Smoking status: Former Smoker   ??? Smokeless tobacco: Never Used   ??? Alcohol use 0.0 oz/week     0 Standard drinks or equivalent per week   ??? Drug use: No   ??? Sexual activity: Yes     Partners: Male     Birth control/ protection: Implant      Comment: Nexplanon Inserted 01/17/2013     Other Topics Concern   ??? None     Social History Narrative       Allergies:    Allergies   Allergen Reactions   ??? Rituxan [Rituximab] Other (See Comments)     Closes up windpipe       Medications:    Current Outpatient Prescriptions on File Prior to Visit   Medication Sig Dispense Refill   ??? B-D INS SYR ULTRAFINE 1CC/30G 30G X 1/2" 1 ML MISC      ??? methotrexate Sodium (RHEUMATREX) 50 MG/2ML SOLN chemo injection      ??? norethindrone-ethinyl estradiol (MICROGESTIN FE 1/20) 1-20 MG-MCG per tablet Take 1 tablet by mouth daily 3 packet 0   ??? ACTEMRA 162 MG/0.9ML SOSY      ??? tiZANidine (ZANAFLEX) 4 MG tablet      ??? clobetasol (TEMOVATE) 0.05 % ointment      ??? PROAIR HFA 108 (90 BASE) MCG/ACT inhaler      ??? omeprazole (PRILOSEC) 20 MG capsule      ??? NUCYNTA 50 MG TABS      ??? DULoxetine (CYMBALTA) 60 MG capsule Take 60 mg by mouth 2 times daily     ??? naproxen (NAPROSYN) 500 MG tablet Take 500 mg by mouth 2  times daily (with meals)     ??? etonogestrel (NEXPLANON) 68 MG implant Inject 68 mg into the skin once Indications: start date 01/17/13 end date 01/18/2016 [facility]      ??? topiramate (TOPAMAX) 50 MG tablet Take 50 mg by mouth 2 times daily       No current facility-administered medications on file prior to visit.        HPI:    HPI on June 6th she had sudden onset of suprapubic pain that was severe and lasted 20 minutes and then it would go away and then return after a few hours. She said any  movement or trying to go to the bathroom brought the pain back. The severe pain lasted from Saturday afternoon until noon on Sunday. She started her menses this am. She had a ct scan which showed a 2.7cm right ovarian cyst. She is s/p lso by dr Caralee Atesandrews in in feb 2016 for a mature teratoma. She had a nexplanon placed 01/2013. Her pain in the the right side has resolved for the past two weeks. She had gas pain in the middle yesterday but that was different and it resolved. Her ultrasound showed the right ovarian cyst resolved.  She is currently being followed for elevated liver fcn tests and liver cysts.    ROS:    Review of Systems    Physical exam:    Physical Exam    Assessment and Plan:    Assessment    1. Cyst of right ovary           Plan she is not to use the microgestin  I spent 15min face to face and more than 50% of the  time was in consultation about ovarian cysts. She is not having any discomfort now so no further workup is needed. We discussed hormones and with elevated liver function tests she will need to check with her pcp to see if the nexplanon is ok  Return for annual

## 2016-04-09 DIAGNOSIS — I10 Essential (primary) hypertension: Secondary | ICD-10-CM

## 2016-04-09 DIAGNOSIS — M797 Fibromyalgia: Secondary | ICD-10-CM | POA: Insufficient documentation

## 2016-04-09 DIAGNOSIS — Z79891 Long term (current) use of opiate analgesic: Secondary | ICD-10-CM | POA: Insufficient documentation

## 2016-04-09 DIAGNOSIS — M059 Rheumatoid arthritis with rheumatoid factor, unspecified: Secondary | ICD-10-CM | POA: Insufficient documentation

## 2016-04-09 HISTORY — DX: Essential (primary) hypertension: I10

## 2016-05-01 ENCOUNTER — Telehealth: Payer: Self-pay | Admitting: *Deleted

## 2016-05-01 NOTE — Telephone Encounter (Signed)
lm on vm stating that the appt for 06/05/16 has been cancelled due to Dr. Starling Manns no longer accepting new pts. i made the pt aware that i will give her record to another doctor for review...td

## 2016-06-05 ENCOUNTER — Ambulatory Visit: Payer: BLUE CROSS/BLUE SHIELD | Admitting: Anesthesiology

## 2016-06-29 ENCOUNTER — Encounter: Payer: Self-pay | Admitting: Anesthesiology

## 2016-06-29 ENCOUNTER — Ambulatory Visit: Payer: BLUE CROSS/BLUE SHIELD | Attending: Anesthesiology | Admitting: Anesthesiology

## 2016-06-29 ENCOUNTER — Encounter (INDEPENDENT_AMBULATORY_CARE_PROVIDER_SITE_OTHER): Payer: Self-pay

## 2016-06-29 VITALS — BP 154/86 | HR 88 | Temp 98.2°F | Resp 16 | Ht 68.0 in | Wt 200.0 lb

## 2016-06-29 DIAGNOSIS — M150 Primary generalized (osteo)arthritis: Secondary | ICD-10-CM

## 2016-06-29 DIAGNOSIS — M542 Cervicalgia: Secondary | ICD-10-CM

## 2016-06-29 DIAGNOSIS — M797 Fibromyalgia: Secondary | ICD-10-CM | POA: Diagnosis not present

## 2016-06-29 DIAGNOSIS — M069 Rheumatoid arthritis, unspecified: Secondary | ICD-10-CM | POA: Diagnosis present

## 2016-06-29 DIAGNOSIS — M51369 Other intervertebral disc degeneration, lumbar region without mention of lumbar back pain or lower extremity pain: Secondary | ICD-10-CM

## 2016-06-29 DIAGNOSIS — G8929 Other chronic pain: Secondary | ICD-10-CM | POA: Diagnosis not present

## 2016-06-29 DIAGNOSIS — M159 Polyosteoarthritis, unspecified: Secondary | ICD-10-CM

## 2016-06-29 DIAGNOSIS — M5136 Other intervertebral disc degeneration, lumbar region: Secondary | ICD-10-CM

## 2016-06-29 DIAGNOSIS — M25569 Pain in unspecified knee: Secondary | ICD-10-CM

## 2016-06-29 MED ORDER — TRAMADOL HCL 50 MG PO TABS: 50.0000 mg | ORAL_TABLET | Freq: Four times a day (QID) | ORAL | Status: DC

## 2016-06-29 NOTE — Progress Notes (Signed)
Patient here today as new patient.  Patient has RA and was dx in 2010.  She has moved her in March from [place].  She has secured an rheumatologist and this is her first pain management appt.  Patient states she also has fibromyalgia which is managed by her rheumatologist.  Safety precautions to be maintained throughout the outpatient stay will include: orient to surroundings, keep bed in low position, maintain call bell within reach at all times, provide assistance with transfer out of bed and ambulation.

## 2016-06-29 NOTE — Patient Instructions (Signed)
Trigger Point Injection Trigger points are areas where you have muscle pain. A trigger point injection is a shot given in the trigger point to relieve that pain. A trigger point might feel like a knot in your muscle. It hurts to press on a trigger point. Sometimes the pain spreads out (radiates) to other parts of the body. For example, pressing on a trigger point in your shoulder might cause pain in your arm or neck. You might have one trigger point. Or, you might have more than one. People often have trigger points in their upper back and lower back. They also occur often in the neck and shoulders. Pain from a trigger point lasts for a long time. It can make it hard to keep moving. You might not be able to do the exercise or physical therapy that could help you deal with the pain. A trigger point injection may help. It does not work for everyone. But, it may relieve your pain for a few days or a few months. A trigger point injection does not cure long-lasting (chronic) pain. LET YOUR CAREGIVER KNOW ABOUT:  Any allergies (especially to latex, lidocaine, or steroids).  Blood-thinning medicines that you take. These drugs can lead to bleeding or bruising after an injection. They include:  Aspirin.  Ibuprofen.  Clopidogrel.  Warfarin.  Other medicines you take. This includes all vitamins, herbs, eyedrops, over-the-counter medicines, and creams.  Use of steroids.  Recent infections.  Past problems with numbing medicines.  Bleeding problems.  Surgeries you have had.  Other health problems. RISKS AND COMPLICATIONS A trigger point injection is a safe treatment. However, problems may develop, such as:  Minor side effects usually go away in 1 to 2 days. These may include:  Soreness.  Bruising.  Stiffness.  More serious problems are rare. But, they may include:  Bleeding under the skin (hematoma).  Skin infection.  Breaking off of the needle under your skin.  Lung  puncture.  The trigger point injection may not work for you. BEFORE THE PROCEDURE You may need to stop taking any medicine that thins your blood. This is to prevent bleeding and bruising. Usually these medicines are stopped several days before the injection. No other preparation is needed. PROCEDURE  A trigger point injection can be given in your caregiver's office or in a clinic. Each injection takes 2 minutes or less.  Your caregiver will feel for trigger points. The caregiver may use a marker to circle the area for the injection.  The skin over the trigger point will be washed with a germ-killing (antiseptic) solution.  The caregiver pinches the spot for the injection.  Then, a very thin needle is used for the shot. You may feel pain or a twitching feeling when the needle enters the trigger point.  A numbing solution may be injected into the trigger point. Sometimes a drug to keep down swelling, redness, and warmth (inflammation) is also injected.  Your caregiver moves the needle around the trigger zone until the tightness and twitching goes away.  After the injection, your caregiver may put gentle pressure over the injection site.  Then it is covered with a bandage. AFTER THE PROCEDURE  You can go right home after the injection.  The bandage can be taken off after a few hours.  You may feel sore and stiff for 1 to 2 days.  Go back to your regular activities slowly. Your caregiver may ask you to stretch your muscles. Do not do anything that takes   extra energy for a few days.  Follow your caregiver's instructions to manage and treat other pain.   This information is not intended to replace advice given to you by your health care provider. Make sure you discuss any questions you have with your health care provider.   Document Released: 11/19/2011 Document Revised: 03/27/2013 Document Reviewed: 11/19/2011 Elsevier Interactive Patient Education 2016 [employer] GENERAL RISKS  AND COMPLICATIONS  What are the risk, side effects and possible complications? Generally speaking, most procedures are safe.  However, with any procedure there are risks, side effects, and the possibility of complications.  The risks and complications are dependent upon the sites that are lesioned, or the type of nerve block to be performed.  The closer the procedure is to the spine, the more serious the risks are.  Great care is taken when placing the radio frequency needles, block needles or lesioning probes, but sometimes complications can occur.  Infection: Any time there is an injection through the skin, there is a risk of infection.  This is why sterile conditions are used for these blocks.  There are four possible types of infection.  Localized skin infection.  Central Nervous System Infection-This can be in the form of Meningitis, which can be deadly.  Epidural Infections-This can be in the form of an epidural abscess, which can cause pressure inside of the spine, causing compression of the spinal cord with subsequent paralysis. This would require an emergency surgery to decompress, and there are no guarantees that the patient would recover from the paralysis.  Discitis-This is an infection of the intervertebral discs.  It occurs in about 1% of discography procedures.  It is difficult to treat and it may lead to surgery.        2. Pain: the needles have to go through skin and soft tissues, will cause soreness.       3. Damage to internal structures:  The nerves to be lesioned may be near blood vessels or    other nerves which can be potentially damaged.       4. Bleeding: Bleeding is more common if the patient is taking blood thinners such as  aspirin, Coumadin, Ticiid, Plavix, etc., or if he/she have some genetic predisposition  such as hemophilia. Bleeding into the spinal canal can cause compression of the spinal  cord with subsequent paralysis.  This would require an emergency surgery to   decompress and there are no guarantees that the patient would recover from the  paralysis.       5. Pneumothorax:  Puncturing of a lung is a possibility, every time a needle is introduced in  the area of the chest or upper back.  Pneumothorax refers to free air around the  collapsed lung(s), inside of the thoracic cavity (chest cavity).  Another two possible  complications related to a similar event would include: Hemothorax and Chylothorax.   These are variations of the Pneumothorax, where instead of air around the collapsed  lung(s), you may have blood or chyle, respectively.       6. Spinal headaches: They may occur with any procedures in the area of the spine.       7. Persistent CSF (Cerebro-Spinal Fluid) leakage: This is a rare problem, but may occur  with prolonged intrathecal or epidural catheters either due to the formation of a fistulous  track or a dural tear.       8. Nerve damage: By working so close to the spinal cord, there   is always a possibility of  nerve damage, which could be as serious as a permanent spinal cord injury with  paralysis.       9. Death:  Although rare, severe deadly allergic reactions known as "Anaphylactic  reaction" can occur to any of the medications used.      10. Worsening of the symptoms:  We can always make thing worse.  What are the chances of something like this happening? Chances of any of this occuring are extremely low.  By statistics, you have more of a chance of getting killed in a motor vehicle accident: while driving to the hospital than any of the above occurring .  Nevertheless, you should be aware that they are possibilities.  In general, it is similar to taking a shower.  Everybody knows that you can slip, hit your head and get killed.  Does that mean that you should not shower again?  Nevertheless always keep in mind that statistics do not mean anything if you happen to be on the wrong side of them.  Even if a procedure has a 1 (one) in a 1,000,000  (million) chance of going wrong, it you happen to be that one..Also, keep in mind that by statistics, you have more of a chance of having something go wrong when taking medications.  Who should not have this procedure? If you are on a blood thinning medication (e.g. Coumadin, Plavix, see list of "Blood Thinners"), or if you have an active infection going on, you should not have the procedure.  If you are taking any blood thinners, please inform your physician.  How should I prepare for this procedure?  Do not eat or drink anything at least six hours prior to the procedure.  Bring a driver with you .  It cannot be a taxi.  Come accompanied by an adult that can drive you back, and that is strong enough to help you if your legs get weak or numb from the local anesthetic.  Take all of your medicines the morning of the procedure with just enough water to swallow them.  If you have diabetes, make sure that you are scheduled to have your procedure done first thing in the morning, whenever possible.  If you have diabetes, take only half of your insulin dose and notify our nurse that you have done so as soon as you arrive at the clinic.  If you are diabetic, but only take blood sugar pills (oral hypoglycemic), then do not take them on the morning of your procedure.  You may take them after you have had the procedure.  Do not take aspirin or any aspirin-containing medications, at least eleven (11) days prior to the procedure.  They may prolong bleeding.  Wear loose fitting clothing that may be easy to take off and that you would not mind if it got stained with Betadine or blood.  Do not wear any jewelry or perfume  Remove any nail coloring.  It will interfere with some of our monitoring equipment.  NOTE: Remember that this is not meant to be interpreted as a complete list of all possible complications.  Unforeseen problems may occur.  BLOOD THINNERS The following drugs contain aspirin or other  products, which can cause increased bleeding during surgery and should not be taken for 2 weeks prior to and 1 week after surgery.  If you should need take something for relief of minor pain, you may take acetaminophen which is found in Tylenol,m Datril, Anacin-3   and Panadol. It is not blood thinner. The products listed below are.  Do not take any of the products listed below in addition to any listed on your instruction sheet.  A.P.C or A.P.C with Codeine Codeine Phosphate Capsules #3 Ibuprofen Ridaura  ABC compound Congesprin Imuran rimadil  Advil Cope Indocin Robaxisal  Alka-Seltzer Effervescent Pain Reliever and Antacid Coricidin or Coricidin-D  Indomethacin Rufen  Alka-Seltzer plus Cold Medicine Cosprin Ketoprofen S-A-C Tablets  Anacin Analgesic Tablets or Capsules Coumadin Korlgesic Salflex  Anacin Extra Strength Analgesic tablets or capsules CP-2 Tablets Lanoril Salicylate  Anaprox Cuprimine Capsules Levenox Salocol  Anexsia-D Dalteparin Magan Salsalate  Anodynos Darvon compound Magnesium Salicylate Sine-off  Ansaid Dasin Capsules Magsal Sodium Salicylate  Anturane Depen Capsules Marnal Soma  APF Arthritis pain formula Dewitt's Pills Measurin Stanback  Argesic Dia-Gesic Meclofenamic Sulfinpyrazone  Arthritis Bayer Timed Release Aspirin Diclofenac Meclomen Sulindac  Arthritis pain formula Anacin Dicumarol Medipren Supac  Analgesic (Safety coated) Arthralgen Diffunasal Mefanamic Suprofen  Arthritis Strength Bufferin Dihydrocodeine Mepro Compound Suprol  Arthropan liquid Dopirydamole Methcarbomol with Aspirin Synalgos  ASA tablets/Enseals Disalcid Micrainin Tagament  Ascriptin Doan's Midol Talwin  Ascriptin A/D Dolene Mobidin Tanderil  Ascriptin Extra Strength Dolobid Moblgesic Ticlid  Ascriptin with Codeine Doloprin or Doloprin with Codeine Momentum Tolectin  Asperbuf Duoprin Mono-gesic Trendar  Aspergum Duradyne Motrin or Motrin IB Triminicin  Aspirin plain, buffered or enteric  coated Durasal Myochrisine Trigesic  Aspirin Suppositories Easprin Nalfon Trillsate  Aspirin with Codeine Ecotrin Regular or Extra Strength Naprosyn Uracel  Atromid-S Efficin Naproxen Ursinus  Auranofin Capsules Elmiron Neocylate Vanquish  Axotal Emagrin Norgesic Verin  Azathioprine Empirin or Empirin with Codeine Normiflo Vitamin E  Azolid Emprazil Nuprin Voltaren  Bayer Aspirin plain, buffered or children's or timed BC Tablets or powders Encaprin Orgaran Warfarin Sodium  Buff-a-Comp Enoxaparin Orudis Zorpin  Buff-a-Comp with Codeine Equegesic Os-Cal-Gesic   Buffaprin Excedrin plain, buffered or Extra Strength Oxalid   Bufferin Arthritis Strength Feldene Oxphenbutazone   Bufferin plain or Extra Strength Feldene Capsules Oxycodone with Aspirin   Bufferin with Codeine Fenoprofen Fenoprofen Pabalate or Pabalate-SF   Buffets II Flogesic Panagesic   Buffinol plain or Extra Strength Florinal or Florinal with Codeine Panwarfarin   Buf-Tabs Flurbiprofen Penicillamine   Butalbital Compound Four-way cold tablets Penicillin   Butazolidin Fragmin Pepto-Bismol   Carbenicillin Geminisyn Percodan   Carna Arthritis Reliever Geopen Persantine   Carprofen Gold's salt Persistin   Chloramphenicol Goody's Phenylbutazone   Chloromycetin Haltrain Piroxlcam   Clmetidine heparin Plaquenil   Cllnoril Hyco-pap Ponstel   Clofibrate Hydroxy chloroquine Propoxyphen         Before stopping any of these medications, be sure to consult the physician who ordered them.  Some, such as Coumadin (Warfarin) are ordered to prevent or treat serious conditions such as "deep thrombosis", "pumonary embolisms", and other heart problems.  The amount of time that you may need off of the medication may also vary with the medication and the reason for which you were taking it.  If you are taking any of these medications, please make sure you notify your pain physician before you undergo any procedures.          

## 2016-06-30 NOTE — Progress Notes (Signed)
Subjective:  Patient ID: Loretta Blankenship, female    DOB: 07-23-1981  Age: 35 y.o. MRN: [id number]  CC: Pain; Knee Pain; Foot Pain; and Hand Pain      PROCEDURE:None  HPI [name] presents for a new patient evaluation. She presents with a long-standing history greater than 10 years of bilateral shoulder and neck pain bilateral low back pain with radiation into both knees affecting both feet. She has been seen at a pain control Center and [facility] and these notes have been requested. She reports that she has been through a series of cervical epidural steroid injections with some relief of her cervical neck pain. She typically has radiation into the left upper extremity affecting the lateral fingers 34 and 5 and has associated diminished grip strength that is also noted to be bilateral. She also develops tension headaches and has been treated for this. She also states that she has chronic low back pain that causes her legs to feel heavy and occasionally weak. She reports having had a previous workup with MRI of the neck and low back within the past 5-10 years but these are unavailable to me at this time. Furthermore she has had physical therapy and does some home exercises for her neck and back. She's been on Nucynta for her chronic pain and rheumatoid arthritis and has been followed previously by rheumatologic clinic and has just moved her care to current medical clinic for rheumatologic management.  In regards to her cervical neck pain she gets radiation and pain in the left posterior aspect of her elbow radiating over the forearm and into the lateral 3 fingers as mentioned. This is been one of her primary pain complaints. She also has profound tenderness and soreness in the bilateral trapezius muscles.  She describes her pain condition as being associated with depression and fatigue and inability to concentrate causing numbness and tingling as mentioned in the hand and associated weakness  with pain that wakes her up at night with the quality pain described as aching agonizing and annoying intermittent and occasionally sharp and shooting both in the left upper shoulder trapezius area with radiation in the hand and occasionally in the low back.  History Loretta Blankenship has a past medical history of Allergy (0717/17); Arthritis; Asthma; Depression; GERD (gastroesophageal reflux disease); and Neuromuscular disorder (HCC) (2010).   She has past surgical history that includes Laparoscopic oopherectomy (Left, 01/2015).   Her family history includes Arthritis/Rheumatoid in her father; Heart disease in her father and mother.She reports that she has never smoked. She does not have any smokeless tobacco history on file. Her alcohol and drug histories are not on file.  No results found for this or any previous visit.  No results found for: TOXASSSELUR  No outpatient prescriptions prior to visit.   No facility-administered medications prior to visit.   No results found for: WBC, HGB, HCT, PLT, GLUCOSE, CHOL, TRIG, HDL, LDLDIRECT, LDLCALC, ALT, AST, NA, K, CL, CREATININE, BUN, CO2, TSH, PSA, INR, GLUF, HGBA1C, MICROALBUR  --------------------------------------------------------------------------------------------------------------------- Patient was never admitted.     ---------------------------------------------------------------------------------------------------------------------- Past Medical History  Diagnosis Date  . Allergy 0717/17    allergies to cats and dogs  . Arthritis   . Asthma   . Depression   . GERD (gastroesophageal reflux disease)   . Neuromuscular disorder (HCC) 2010    fibromyalgia    Past Surgical History  Procedure Laterality Date  . Laparoscopic oopherectomy Left 01/2015    dermoid cyst that ruptured the tube  Family History  Problem Relation Age of Onset  . Heart disease Mother   . Heart disease Father   . Arthritis/Rheumatoid Father     Social  History  Substance Use Topics  . Smoking status: Never Smoker   . Smokeless tobacco: Not on file  . Alcohol Use: Not on file    ---------------------------------------------------------------------------------------------------------------------- Social History   Social History  . Marital Status: Unknown    Spouse Name: N/A  . Number of Children: N/A  . Years of Education: N/A   Social History Main Topics  . Smoking status: Never Smoker   . Smokeless tobacco: None  . Alcohol Use: None  . Drug Use: None  . Sexual Activity: Not Asked   Other Topics Concern  . None   Social History Narrative  . None    Scheduled Meds: Continuous Infusions: PRN Meds:.   BP 154/86 mmHg  Pulse 88  Temp(Src) 98.2 F (36.8 C) (Oral)  Resp 16  Ht 5\' 8"  (1.727 m)  Wt 200 lb (90.719 kg)  BMI 30.42 kg/m2  SpO2 99%  LMP 06/15/2016   BP Readings from Last 3 Encounters:  06/29/16 154/86     Wt Readings from Last 3 Encounters:  06/29/16 200 lb (90.719 kg)     ----------------------------------------------------------------------------------------------------------------------  ROS Review of Systems  Cardiac: Negative Pulmonary: Asthma bronchitis and snoring Neurologic: As above Psychological: Depression GI: Reflux GU: Recurrent UTI Hematologic: Easy bruising Rheumatologic: Rheumatoid arthritis fibromyalgia and chronic fatigue syndrome  Objective:  BP 154/86 mmHg  Pulse 88  Temp(Src) 98.2 F (36.8 C) (Oral)  Resp 16  Ht 5\' 8"  (1.727 m)  Wt 200 lb (90.719 kg)  BMI 30.42 kg/m2  SpO2 99%  LMP 06/15/2016  Physical Exam Patient is a very emotional 35 year old white female she is alert oriented cooperative but frequently very tearful during evaluation. Pupils are equally round reactive to light extraocular muscles intact Heart is regular rate and rhythm without murmur Lungs are clear to also dictation no wheezes or rales Abdomen is soft and nontender Evaluation of the  left trapezius reveals 2 trigger points that do radiate into the elbow she also has diminished grip strength bilaterally with both hands. Her bicep tricep strength on the right is 5 over 5 on the left her bicep strength is 5 over 5 but she has 5 minus over 5 extension on the left. Evaluation of the low back reveals some paraspinous muscle tenderness. She has significant pain on extension at the low back in the standing position. She has a positive straight leg raise on the right side negative on the left but her strength in the lower extremities appears to be intact at 5 over 5 both proximal and distal with sensation intact good muscle tone and bulk.     Assessment & Plan:   Takiah was seen today for pain, knee pain, foot pain and hand pain.  Diagnoses and all orders for this visit:  Chronic pain -     ToxASSURE Select 13 (MW), Urine  DDD (degenerative disc disease), lumbar  Cervicalgia -     TRIGGER POINT INJECTION; Future  Primary osteoarthritis involving multiple joints -     Ambulatory referral to Orthopedic Surgery  Knee pain, chronic, unspecified laterality -     Ambulatory referral to Orthopedic Surgery  Other orders -     traMADol (ULTRAM) 50 MG tablet; Take 1 tablet (50 mg total) by mouth 4 (four) times daily.     ----------------------------------------------------------------------------------------------------------------------  Problem List Items  Addressed This Visit    None    Visit Diagnoses    Chronic pain    -  Primary    Relevant Medications    tiZANidine (ZANAFLEX) 4 MG tablet    topiramate (TOPAMAX) 50 MG tablet    DULoxetine (CYMBALTA) 60 MG capsule    Tocilizumab (ACTEMRA) 162 MG/0.9ML SOSY    Methotrexate, PF, 7.5 MG/0.15ML SOAJ    Naproxen Sodium 220 MG CAPS    predniSONE (DELTASONE) 10 MG tablet    traMADol (ULTRAM) 50 MG tablet    Other Relevant Orders    ToxASSURE Select 13 (MW), Urine    DDD (degenerative disc disease), lumbar         Relevant Medications    tiZANidine (ZANAFLEX) 4 MG tablet    Tocilizumab (ACTEMRA) 162 MG/0.9ML SOSY    Methotrexate, PF, 7.5 MG/0.15ML SOAJ    Naproxen Sodium 220 MG CAPS    predniSONE (DELTASONE) 10 MG tablet    traMADol (ULTRAM) 50 MG tablet    Cervicalgia        Relevant Orders    TRIGGER POINT INJECTION    Primary osteoarthritis involving multiple joints        Relevant Medications    tiZANidine (ZANAFLEX) 4 MG tablet    Tocilizumab (ACTEMRA) 162 MG/0.9ML SOSY    Methotrexate, PF, 7.5 MG/0.15ML SOAJ    Naproxen Sodium 220 MG CAPS    predniSONE (DELTASONE) 10 MG tablet    traMADol (ULTRAM) 50 MG tablet    Other Relevant Orders    Ambulatory referral to Orthopedic Surgery    Knee pain, chronic, unspecified laterality        Relevant Orders    Ambulatory referral to Orthopedic Surgery       ----------------------------------------------------------------------------------------------------------------------  1. Chronic pain We will attempt to acquire her previous evaluation notes from the pain clinic as mentioned today - ToxASSURE Select 13 (MW), Urine  2. DDD (degenerative disc disease), lumbar Continue physical therapy exercises she may be a candidate for an epidural steroid injection  3. Cervicalgia  - TRIGGER POINT INJECTION; Future  4. Primary osteoarthritis involving multiple joints Continue follow-up with her rheumatologist at [facility]. We'll start her on Ultram 50 mg tablets 1 or 2 tablets on a 2-3 times a day basis. She's been on Nucynta in the past with good relief of her generalized pain and we may consider this at her next evaluation and based on her urine tox screen. - Ambulatory referral to Orthopedic Surgery  5. Knee pain, chronic, unspecified laterality  - Ambulatory referral to Orthopedic Surgery    ----------------------------------------------------------------------------------------------------------------------  I am having Ms. Handley  start on traMADol. I am also having her maintain her tiZANidine, topiramate, DULoxetine, Tocilizumab, Methotrexate (PF), etonogestrel, diclofenac sodium, Naproxen Sodium, and predniSONE.   Meds ordered this encounter  Medications  . tiZANidine (ZANAFLEX) 4 MG tablet    Sig: Take 4 mg by mouth at bedtime.  . topiramate (TOPAMAX) 50 MG tablet    Sig: Take 50 mg by mouth 2 (two) times daily.  . DULoxetine (CYMBALTA) 60 MG capsule    Sig: Take 60 mg by mouth daily.  . Tocilizumab (ACTEMRA) 162 MG/0.9ML SOSY    Sig: Inject into the skin once a week.  . Methotrexate, PF, 7.5 MG/0.15ML SOAJ    Sig: Inject into the skin once a week.  . etonogestrel (NEXPLANON) 68 MG IMPL implant    Sig: 1 each by Subdermal route once.  . diclofenac sodium (VOLTAREN) 1 %  GEL    Sig: Apply topically 4 (four) times daily.  . Naproxen Sodium 220 MG CAPS    Sig: Take 2 capsules by mouth 2 (two) times daily before a meal.  . predniSONE (DELTASONE) 10 MG tablet    Sig: Take 10 mg by mouth daily with breakfast.  . traMADol (ULTRAM) 50 MG tablet    Sig: Take 1 tablet (50 mg total) by mouth 4 (four) times daily.    Dispense:  120 tablet    Refill:  1       Follow-up: Return in about 2 weeks (around 07/13/2016) for evaluation, procedure.    Yevette Edwards, MD  This dictation was performed utilizing Dragon voice recognition software.  Please excuse any unintentional or mistaken typographical errors as a result of its unedited utilization.

## 2016-07-09 LAB — TOXASSURE SELECT 13 (MW), URINE: PDF: 0

## 2016-07-15 ENCOUNTER — Encounter: Payer: Self-pay | Admitting: Anesthesiology

## 2016-07-15 ENCOUNTER — Ambulatory Visit: Payer: BLUE CROSS/BLUE SHIELD | Attending: Anesthesiology | Admitting: Anesthesiology

## 2016-07-15 VITALS — BP 147/80 | HR 80 | Temp 98.5°F | Resp 16 | Ht 68.0 in | Wt 195.0 lb

## 2016-07-15 DIAGNOSIS — M797 Fibromyalgia: Secondary | ICD-10-CM | POA: Diagnosis not present

## 2016-07-15 DIAGNOSIS — J45909 Unspecified asthma, uncomplicated: Secondary | ICD-10-CM | POA: Insufficient documentation

## 2016-07-15 DIAGNOSIS — F329 Major depressive disorder, single episode, unspecified: Secondary | ICD-10-CM | POA: Diagnosis not present

## 2016-07-15 DIAGNOSIS — M542 Cervicalgia: Secondary | ICD-10-CM

## 2016-07-15 DIAGNOSIS — K219 Gastro-esophageal reflux disease without esophagitis: Secondary | ICD-10-CM | POA: Insufficient documentation

## 2016-07-15 DIAGNOSIS — M25512 Pain in left shoulder: Secondary | ICD-10-CM | POA: Insufficient documentation

## 2016-07-15 DIAGNOSIS — G8929 Other chronic pain: Secondary | ICD-10-CM | POA: Insufficient documentation

## 2016-07-15 MED ORDER — DEXAMETHASONE SODIUM PHOSPHATE 10 MG/ML IJ SOLN: 10.0000 mg | Freq: Once | INTRAMUSCULAR | Status: DC

## 2016-07-15 MED ORDER — DEXAMETHASONE SODIUM PHOSPHATE 10 MG/ML IJ SOLN
INTRAMUSCULAR | Status: AC
Start: 2016-07-15 — End: 2016-07-16
  Filled 2016-07-15: qty 1

## 2016-07-15 MED ORDER — ROPIVACAINE HCL 2 MG/ML IJ SOLN
INTRAMUSCULAR | Status: AC
  Filled 2016-07-15: qty 10

## 2016-07-15 MED ORDER — ROPIVACAINE HCL 5 MG/ML IJ SOLN
15.0000 mL | Freq: Once | INTRAMUSCULAR | Status: DC
  Filled 2016-07-15: qty 15

## 2016-07-15 NOTE — Patient Instructions (Signed)

## 2016-07-16 NOTE — Progress Notes (Signed)
Subjective:  Patient ID: Loretta Blankenship, female    DOB: [birth year]  Age: 35 y.o. MRN: [id number]  CC: Shoulder Pain (LEFT)   Service Provided on Last Visit: Evaluation  PROCEDURE:Trigger point injection 3 to the left mid body trapezius  HPI Loretta Blankenship presents for a new re evaluation. She presents with a long-standing history greater than 10 years of bilateral shoulder and neck pain bilateral low back pain with radiation into both knees affecting both feet. She has been seen at a pain control Center and [facility] and these notes have been requested. She reports that she has been through a series of cervical epidural steroid injections with some relief of her cervical neck pain. She typically has radiation into the left upper extremity affecting the lateral fingers 34 and 5 and has associated diminished grip strength that is also noted to be bilateral. She also develops tension headaches and has been treated for this. She also states that she has chronic low back pain that causes her legs to feel heavy and occasionally weak. She reports having had a previous workup with MRI of the neck and low back within the past 5-10 years but these are unavailable to me at this time. Furthermore she has had physical therapy and does some home exercises for her neck and back. She's been on Nucynta for her chronic pain and rheumatoid arthritis and has been followed previously by rheumatologic clinic and has just moved her care to current medical clinic for rheumatologic management.  In regards to her cervical neck pain she gets radiation and pain in the left posterior aspect of her elbow radiating over the forearm and into the lateral 3 fingers as mentioned. This is been one of her primary pain complaints. She also has profound tenderness and soreness in the bilateral trapezius muscles which is worse on the left side  She describes her pain condition as being associated with depression and fatigue and  inability to concentrate causing numbness and tingling as mentioned in the hand and associated weakness with pain that wakes her up at night with the quality pain described as aching agonizing and annoying intermittent and occasionally sharp and shooting both in the left upper shoulder trapezius area with radiation in the hand and occasionally in the low back.  History Loretta Blankenship has a past medical history of Allergy (0717/17); Arthritis; Asthma; Depression; GERD (gastroesophageal reflux disease); and Neuromuscular disorder (HCC) (2010).   She has a past surgical history that includes Laparoscopic oopherectomy (Left, 01/2015).   Her family history includes Arthritis/Rheumatoid in her father; Heart disease in her father and mother.She reports that she has never smoked. She does not have any smokeless tobacco history on file. Her alcohol and drug histories are not on file.  No results found for this or any previous visit.  ToxAssure Select 13  Date Value Ref Range Status  06/29/2016 FINAL  Final    Comment:    ==================================================================== TOXASSURE SELECT 13 (MW) ==================================================================== Test                             Result       Flag       Units Drug Present not Declared for Prescription Verification   Amphetamine                    332          UNEXPECTED ng/mg creat    Amphetamine is  available as a schedule II prescription drug. ==================================================================== Test                      Result    Flag   Units      Ref Range   Creatinine              145              mg/dL      >=64 ==================================================================== Declared Medications:  The flagging and interpretation on this report are based on the  following declared medications.  Unexpected results may arise from  inaccuracies in the declared medications.  **Note: The testing  scope of this panel does not include following  reported medications:  Diclofenac (Voltaren)  Duloxetine (Cymbalta)  Etonogestrel (Nexplanon)  Methotrexate  Naproxen  Prednisone (Deltasone)  Tizanidine (Zanaflex)  Tocilizumab  Topiramate (Topamax) ==================================================================== For clinical consultation, please call [phone]. ====================================================================     Outpatient Medications Prior to Visit  Medication Sig Dispense Refill  . diclofenac sodium (VOLTAREN) 1 % GEL Apply topically 4 (four) times daily.    . DULoxetine (CYMBALTA) 60 MG capsule Take 60 mg by mouth daily.    Marland Kitchen etonogestrel (NEXPLANON) 68 MG IMPL implant 1 each by Subdermal route once.    . Methotrexate, PF, 7.5 MG/0.15ML SOAJ Inject into the skin once a week.    . Naproxen Sodium 220 MG CAPS Take 2 capsules by mouth 2 (two) times daily before a meal.    . predniSONE (DELTASONE) 10 MG tablet Take 10 mg by mouth daily with breakfast.    . tiZANidine (ZANAFLEX) 4 MG tablet Take 4 mg by mouth at bedtime.    . Tocilizumab (ACTEMRA) 162 MG/0.9ML SOSY Inject into the skin once a week.    . topiramate (TOPAMAX) 50 MG tablet Take 50 mg by mouth 2 (two) times daily.    . traMADol (ULTRAM) 50 MG tablet Take 1 tablet (50 mg total) by mouth 4 (four) times daily. 120 tablet 1   No facility-administered medications prior to visit.    No results found for: WBC, HGB, HCT, PLT, GLUCOSE, CHOL, TRIG, HDL, LDLDIRECT, LDLCALC, ALT, AST, NA, K, CL, CREATININE, BUN, CO2, TSH, PSA, INR, GLUF, HGBA1C, MICROALBUR  --------------------------------------------------------------------------------------------------------------------- Patient was never admitted.     ---------------------------------------------------------------------------------------------------------------------- Past Medical History:  Diagnosis Date  . Allergy 0717/17   allergies to  cats and dogs  . Arthritis   . Asthma   . Depression   . GERD (gastroesophageal reflux disease)   . Neuromuscular disorder (HCC) 2010   fibromyalgia    Past Surgical History:  Procedure Laterality Date  . LAPAROSCOPIC OOPHERECTOMY Left 01/2015   dermoid cyst that ruptured the tube    Family History  Problem Relation Age of Onset  . Heart disease Mother   . Heart disease Father   . Arthritis/Rheumatoid Father     Social History  Substance Use Topics  . Smoking status: Never Smoker  . Smokeless tobacco: Not on file  . Alcohol use Not on file    ---------------------------------------------------------------------------------------------------------------------- Social History   Social History  . Marital status: Unknown    Spouse name: N/A  . Number of children: N/A  . Years of education: N/A   Social History Main Topics  . Smoking status: Never Smoker  . Smokeless tobacco: None  . Alcohol use None  . Drug use: Unknown  . Sexual activity: Not Asked   Other Topics Concern  . None  Social History Narrative  . None    Scheduled Meds: Continuous Infusions: PRN Meds:.   BP (!) 147/80   Pulse 80   Temp 98.5 F (36.9 C) (Oral)   Resp 16   Ht 5\' 8"  (1.727 m)   Wt 195 lb (88.5 kg)   LMP 06/15/2016   SpO2 99%   BMI 29.65 kg/m    BP Readings from Last 3 Encounters:  07/15/16 (!) 147/80  06/29/16 (!) 154/86     Wt Readings from Last 3 Encounters:  07/15/16 195 lb (88.5 kg)  06/29/16 200 lb (90.7 kg)     ----------------------------------------------------------------------------------------------------------------------  ROS Review of Systems  Cardiac: Negative Pulmonary: Asthma bronchitis and snoring Neurologic: As above   Objective:  BP (!) 147/80   Pulse 80   Temp 98.5 F (36.9 C) (Oral)   Resp 16   Ht 5\' 8"  (1.727 m)   Wt 195 lb (88.5 kg)   LMP 06/15/2016   SpO2 99%   BMI 29.65 kg/m   Physical Exam Patient is a very  emotional 35 year old white female she is alert oriented cooperative but frequently very tearful during evaluation. Pupils are equally round reactive to light extraocular muscles intact Heart is regular rate and rhythm without murmur Lungs are clear to also dictation no wheezes or rales Abdomen is soft and nontender Evaluation of the left trapezius reveals 2 trigger points that do radiate into the elbow she also has diminished grip strength bilaterally with both hands. Her bicep tricep strength on the right is 5 over 5 on the left her bicep strength is 5 over 5 but she has 5 minus over 5 extension on the left.    Assessment & Plan:   Loretta Blankenship was seen today for shoulder pain.  Diagnoses and all orders for this visit:  Chronic pain -     TRIGGER POINT INJECTION; Future  Cervicalgia -     TRIGGER POINT INJECTION -     TRIGGER POINT INJECTION; Future -     dexamethasone (DECADRON) injection 10 mg; Inject 1 mL (10 mg total) into the articular space once. -     ropivacaine (PF) (NAROPIN) injection 15 mL; 15 mLs by Epidural route once.  Other orders -     ropivacaine (PF) 2 mg/ml (0.2%) (NAROPIN) 2 MG/ML epidural;  -     dexamethasone (DECADRON) 10 MG/ML injection;      ----------------------------------------------------------------------------------------------------------------------  Problem List Items Addressed This Visit    None    Visit Diagnoses    Chronic pain    -  Primary   Relevant Medications   dexamethasone (DECADRON) injection 10 mg   ropivacaine (PF) (NAROPIN) injection 15 mL   Other Relevant Orders   TRIGGER POINT INJECTION   Cervicalgia       Relevant Medications   dexamethasone (DECADRON) injection 10 mg   ropivacaine (PF) (NAROPIN) injection 15 mL   Other Relevant Orders   TRIGGER POINT INJECTION      ----------------------------------------------------------------------------------------------------------------------  1. Chronic pain We will  attempt to acquire her previous evaluation notes from the pain clinic as mentioned today   2. DDD (degenerative disc disease), lumbar Continue physical therapy exercises she may be a candidate for an epidural steroid injection  3. Cervicalgia  - TRIGGER POINT INJECTION; 3 today to mid body trapezius with return to clinic in 2 weeks  4. Primary osteoarthritis involving multiple joints Continue follow-up with her rheumatologist at [facility]. We'll start her on Ultram 50 mg tablets 1 or  2 tablets on a 2-3 times a day basis. She's been on Nucynta in the past with good relief of her generalized pain and we may consider this at her next evaluation and based on her urine tox screen. - Ambulatory referral to Orthopedic Surgery  5. Knee pain, chronic, unspecified laterality  - Ambulatory referral to Orthopedic Surgery    ----------------------------------------------------------------------------------------------------------------------  I am having Ms. Rabon maintain her tiZANidine, topiramate, DULoxetine, Tocilizumab, Methotrexate (PF), etonogestrel, diclofenac sodium, Naproxen Sodium, predniSONE, traMADol, and Clobetasol Prop Oint-Coal Tar (CLOBETAPLUS OINTMENT EX). We will continue to administer dexamethasone and ropivacaine (PF).   Meds ordered this encounter  Medications  . dexamethasone (DECADRON) injection 10 mg  . ropivacaine (PF) (NAROPIN) injection 15 mL  . ropivacaine (PF) 2 mg/ml (0.2%) (NAROPIN) 2 MG/ML epidural    Vivia Ewing, Dena: cabinet override  . dexamethasone (DECADRON) 10 MG/ML injection    Vivia Ewing, Dena: cabinet override  . Clobetasol Prop Oint-Coal Tar (CLOBETAPLUS OINTMENT EX)    Sig: Apply topically as needed.   :Trigger point injectionx 3: The area overlying the aforementioned trigger points were prepped with alcohol. They were then injected with a 25-gauge needle with  3cc of ropivacaine 0.2% and Decadron 3 mg at each site after negative aspiration for  heme. This was performed after informed consent was obtained and risks and benefits reviewed. She tolerated this procedure without difficulty and was convalesced and discharged to home in stable condition for follow-up as mentioned.  @James  , MD@    Follow-up: Return in about 2 weeks (around 07/29/2016) for procedure.    07/31/2016, MD  This dictation was performed utilizing Dragon voice recognition software.  Please excuse any unintentional or mistaken typographical errors as a result of its unedited utilization.

## 2016-07-28 ENCOUNTER — Ambulatory Visit: Payer: BLUE CROSS/BLUE SHIELD | Attending: Anesthesiology | Admitting: Anesthesiology

## 2016-07-28 ENCOUNTER — Encounter: Payer: Self-pay | Admitting: Anesthesiology

## 2016-07-28 VITALS — BP 161/93 | HR 87 | Temp 98.5°F | Resp 18 | Ht 68.0 in | Wt 200.0 lb

## 2016-07-28 DIAGNOSIS — M542 Cervicalgia: Secondary | ICD-10-CM | POA: Diagnosis present

## 2016-07-28 DIAGNOSIS — G8929 Other chronic pain: Secondary | ICD-10-CM | POA: Diagnosis not present

## 2016-07-28 MED ORDER — DEXAMETHASONE SODIUM PHOSPHATE 10 MG/ML IJ SOLN
10.0000 mg | Freq: Once | INTRAMUSCULAR | Status: AC
  Administered 2016-07-28: 10 mg
  Filled 2016-07-28: qty 1

## 2016-07-28 MED ORDER — ROPIVACAINE HCL 2 MG/ML IJ SOLN
10.0000 mL | Freq: Once | INTRAMUSCULAR | Status: AC
  Administered 2016-07-28: 10 mL via EPIDURAL
  Filled 2016-07-28: qty 10

## 2016-07-28 MED ORDER — TRAMADOL HCL 50 MG PO TABS: 100.0000 mg | ORAL_TABLET | Freq: Three times a day (TID) | ORAL | 1 refills | Status: DC

## 2016-07-28 NOTE — Progress Notes (Signed)
Safety precautions to be maintained throughout the outpatient stay will include: orient to surroundings, keep bed in low position, maintain call bell within reach at all times, provide assistance with transfer out of bed and ambulation.  

## 2016-07-29 ENCOUNTER — Telehealth: Payer: Self-pay | Admitting: *Deleted

## 2016-07-29 NOTE — Progress Notes (Signed)
Subjective:  Patient ID: Loretta Blankenship, female    DOB: 24-Apr-1981  Age: 35 y.o. MRN: [id number]  CC: Neck Pain (mid- down shouders arms bilaterally) and Back Pain (lower)   Service Provided on Last Visit: Procedure  PROCEDURE:#2 Trigger point injection 3 to the left mid body trapezius  HPI: Loretta Blankenship trigger point injections 1 month ago. This was done to the trapezius musculature on the left side and she reports that she gain considerable relief. She feels less tender in this region and is experiencing less radiating pain. She still has chronically diminished strength to the upper extremities. And this has not changed following her injection but the pain that she reports is diminished. She is taking her medications as prescribed.  History: Loretta Blankenship presents for  re evaluation. She presents with a long-standing history greater than 10 years of bilateral shoulder and neck pain bilateral low back pain with radiation into both knees affecting both feet. She has been seen at a pain control [facility] and [facility] and these notes have been requested. She reports that she has been through a series of cervical epidural steroid injections with some relief of her cervical neck pain. She typically has radiation into the left upper extremity affecting the lateral fingers 34 and 5 and has associated diminished grip strength that is also noted to be bilateral. She also develops tension headaches and has been treated for this. She also states that she has chronic low back pain that causes her legs to feel heavy and occasionally weak. She reports having had a previous workup with MRI of the neck and low back within the past 5-10 years but these are unavailable to me at this time. Furthermore she has had physical therapy and does some home exercises for her neck and back. She's been on Nucynta for her chronic pain and rheumatoid arthritis and has been followed previously by rheumatologic clinic and has just moved  her care to current medical clinic for rheumatologic management.  In regards to her cervical neck pain she gets radiation and pain in the left posterior aspect of her elbow radiating over the forearm and into the lateral 3 fingers as mentioned. This is been one of her primary pain complaints. She also has profound tenderness and soreness in the bilateral trapezius muscles which is worse on the left side  She describes her pain condition as being associated with depression and fatigue and inability to concentrate causing numbness and tingling as mentioned in the hand and associated weakness with pain that wakes her up at night with the quality pain described as aching agonizing and annoying intermittent and occasionally sharp and shooting both in the left upper shoulder trapezius area with radiation in the hand and occasionally in the low back.  History Loretta Blankenship has a past medical history of Allergy (0717/17); Arthritis; Asthma; Depression; GERD (gastroesophageal reflux disease); and Neuromuscular disorder (HCC) (2010).   She has a past surgical history that includes Laparoscopic oopherectomy (Left, 01/2015).   Her family history includes Arthritis/Rheumatoid in her father; Heart disease in her father and mother.She reports that she has never smoked. She does not have any smokeless tobacco history on file. Her alcohol and drug histories are not on file.  No results found for this or any previous visit.  ToxAssure Select 13  Date Value Ref Range Status  06/29/2016 FINAL  Final    Comment:    ==================================================================== TOXASSURE SELECT 13 (MW) ==================================================================== Test  Result       Flag       Units Drug Present not Declared for Prescription Verification   Amphetamine                    332          UNEXPECTED ng/mg creat    Amphetamine is available as a schedule II prescription  drug. ==================================================================== Test                      Result    Flag   Units      Ref Range   Creatinine              145              mg/dL      >=42 ==================================================================== Declared Medications:  The flagging and interpretation on this report are based on the  following declared medications.  Unexpected results may arise from  inaccuracies in the declared medications.  **Note: The testing scope of this panel does not include following  reported medications:  Diclofenac (Voltaren)  Duloxetine (Cymbalta)  Etonogestrel (Nexplanon)  Methotrexate  Naproxen  Prednisone (Deltasone)  Tizanidine (Zanaflex)  Tocilizumab  Topiramate (Topamax) ==================================================================== For clinical consultation, please call [phone]. ====================================================================     Outpatient Medications Prior to Visit  Medication Sig Dispense Refill  . Clobetasol Prop Oint-Coal Tar (CLOBETAPLUS OINTMENT EX) Apply topically as needed.    . diclofenac sodium (VOLTAREN) 1 % GEL Apply topically 4 (four) times daily.    . DULoxetine (CYMBALTA) 60 MG capsule Take 60 mg by mouth daily.    Marland Kitchen etonogestrel (NEXPLANON) 68 MG IMPL implant 1 each by Subdermal route once.    . Methotrexate, PF, 7.5 MG/0.15ML SOAJ Inject into the skin once a week.    . Naproxen Sodium 220 MG CAPS Take 2 capsules by mouth 2 (two) times daily before a meal.    . predniSONE (DELTASONE) 10 MG tablet Take 10 mg by mouth daily with breakfast.    . tiZANidine (ZANAFLEX) 4 MG tablet Take 4 mg by mouth at bedtime.    . Tocilizumab (ACTEMRA) 162 MG/0.9ML SOSY Inject into the skin once a week.    . topiramate (TOPAMAX) 50 MG tablet Take 50 mg by mouth 2 (two) times daily.    . traMADol (ULTRAM) 50 MG tablet Take 1 tablet (50 mg total) by mouth 4 (four) times daily. 120 tablet 1    Facility-Administered Medications Prior to Visit  Medication Dose Route Frequency Provider Last Rate Last Dose  . dexamethasone (DECADRON) injection 10 mg  10 mg Intra-articular Once Yevette Edwards, MD      . ropivacaine (PF) (NAROPIN) injection 15 mL  15 mL Epidural Once Yevette Edwards, MD       No results found for: WBC, HGB, HCT, PLT, GLUCOSE, CHOL, TRIG, HDL, LDLDIRECT, LDLCALC, ALT, AST, NA, K, CL, CREATININE, BUN, CO2, TSH, PSA, INR, GLUF, HGBA1C, MICROALBUR  --------------------------------------------------------------------------------------------------------------------- Patient was never admitted.     ---------------------------------------------------------------------------------------------------------------------- Past Medical History:  Diagnosis Date  . Allergy 0717/17   allergies to cats and dogs  . Arthritis   . Asthma   . Depression   . GERD (gastroesophageal reflux disease)   . Neuromuscular disorder (HCC) 2010   fibromyalgia    Past Surgical History:  Procedure Laterality Date  . LAPAROSCOPIC OOPHERECTOMY Left 01/2015   dermoid cyst that ruptured the tube    Family History  Problem Relation Age of Onset  . Heart disease Mother   . Heart disease Father   . Arthritis/Rheumatoid Father     Social History  Substance Use Topics  . Smoking status: Never Smoker  . Smokeless tobacco: Not on file  . Alcohol use Not on file    ---------------------------------------------------------------------------------------------------------------------- Social History   Social History  . Marital status: Unknown    Spouse name: N/A  . Number of children: N/A  . Years of education: N/A   Social History Main Topics  . Smoking status: Never Smoker  . Smokeless tobacco: None  . Alcohol use None  . Drug use: Unknown  . Sexual activity: Not Asked   Other Topics Concern  . None   Social History Narrative  . None    BP (!) 161/93   Pulse 87   Temp 98.5  F (36.9 C) (Oral)   Resp 18   Ht 5\' 8"  (1.727 m)   Wt 200 lb (90.7 kg)   LMP 06/15/2016   SpO2 95%   BMI 30.41 kg/m    BP Readings from Last 3 Encounters:  07/28/16 (!) 161/93  07/15/16 (!) 147/80  06/29/16 (!) 154/86     Wt Readings from Last 3 Encounters:  07/28/16 200 lb (90.7 kg)  07/15/16 195 lb (88.5 kg)  06/29/16 200 lb (90.7 kg)     ----------------------------------------------------------------------------------------------------------------------  ROS Review of Systems  Cardiac: Negative Pulmonary: Asthma bronchitis and snoring Neurologic: As above   Objective:  BP (!) 161/93   Pulse 87   Temp 98.5 F (36.9 C) (Oral)   Resp 18   Ht 5\' 8"  (1.727 m)   Wt 200 lb (90.7 kg)   LMP 06/15/2016   SpO2 95%   BMI 30.41 kg/m   Physical Exam Patient is a very emotional 35 year old white female she is alert oriented cooperative but frequently very tearful during evaluation. Pupils are equally round reactive to light extraocular muscles intact Heart is regular rate and rhythm without murmur Lungs are clear to also dictation no wheezes or rales Abdomen is soft and nontender Evaluation of the left trapezius reveals 2 trigger points that do radiate into the elbow but appear significantly less painful to palpation on previous examination. she also has diminished grip strength bilaterally with both hands. Her bicep tricep strength on the right is 5 over 5 on the left her bicep strength is 5 over 5 but she has 5 minus over 5 extension on the left.    Assessment & Plan:   Loretta Blankenship was seen today for neck pain and back pain.  Diagnoses and all orders for this visit:  Cervicalgia -     dexamethasone (DECADRON) injection 10 mg; 1 mL (10 mg total) by Other route once. -     ropivacaine (PF) 2 mg/ml (0.2%) (NAROPIN) epidural 10 mL; 10 mLs by Epidural route once. -     TRIGGER POINT INJECTION; Future  Chronic pain -     TRIGGER POINT INJECTION; Future  Other  orders -     traMADol (ULTRAM) 50 MG tablet; Take 2 tablets (100 mg total) by mouth 3 (three) times daily.     ----------------------------------------------------------------------------------------------------------------------  Problem List Items Addressed This Visit    None    Visit Diagnoses    Cervicalgia    -  Primary   Relevant Medications   dexamethasone (DECADRON) injection 10 mg (Completed)   ropivacaine (PF) 2 mg/ml (0.2%) (NAROPIN) epidural 10 mL (Completed)   Other Relevant Orders  TRIGGER POINT INJECTION   Chronic pain       Relevant Medications   dexamethasone (DECADRON) injection 10 mg (Completed)   ropivacaine (PF) 2 mg/ml (0.2%) (NAROPIN) epidural 10 mL (Completed)   traMADol (ULTRAM) 50 MG tablet   Other Relevant Orders   TRIGGER POINT INJECTION      ----------------------------------------------------------------------------------------------------------------------  1. Chronic pain We will attempt to acquire her previous evaluation notes from the pain clinic as mentioned today   2. DDD (degenerative disc disease), lumbar Continue physical therapy exercises. She may ultimately be a candidate for a lumbar epidural steroid injection. 3. Cervicalgia  - TRIGGER POINT INJECTION; 3 today to mid body trapezius with return to clinic in 1 month and continue physical therapy exercises  4. Primary osteoarthritis involving multiple joints Continue follow-[facility]. We'll continue her on Ultram 50 mg tablets 1 or 2 tablets on a 2-3 times a day basis. She's been on Nucynta in the past with good relief of her generalized pain and we may consider this at her next evaluation and based on her urine tox screen. - Ambulatory referral to Orthopedic Surgery  5. Knee pain, chronic, unspecified laterality  - Ambulatory referral to Orthopedic  Surgery    ----------------------------------------------------------------------------------------------------------------------  I have changed Ms. Loretta MiyamotoKingan's traMADol. I am also having her maintain her tiZANidine, topiramate, DULoxetine, Tocilizumab, Methotrexate (PF), etonogestrel, diclofenac sodium, Naproxen Sodium, predniSONE, and Clobetasol Prop Oint-Coal Tar (CLOBETAPLUS OINTMENT EX). We administered dexamethasone and ropivacaine (PF) 2 mg/ml (0.2%). We will continue to administer dexamethasone and ropivacaine (PF).   Meds ordered this encounter  Medications  . dexamethasone (DECADRON) injection 10 mg  . ropivacaine (PF) 2 mg/ml (0.2%) (NAROPIN) epidural 10 mL  . traMADol (ULTRAM) 50 MG tablet    Sig: Take 2 tablets (100 mg total) by mouth 3 (three) times daily.    Dispense:  120 tablet    Refill:  1   :Trigger point injectionx 3: The area overlying the aforementioned trigger points were prepped with alcohol. They were then injected with a 25-gauge needle with  4cc of ropivacaine 0.2% and Decadron 4 mg at each site after negative aspiration for heme. This was performed after informed consent was obtained and risks and benefits reviewed. She tolerated this procedure without difficulty and was convalesced and discharged to home in stable condition for follow-up as mentioned.  @Nicole Defino, MD@    Follow-up: Return in about 1 month (around 08/28/2016) for evaluation, procedure.    Yevette EdwardsJames G Shauntel Prest, MD  This dictation was performed utilizing Dragon voice recognition software.  Please excuse any unintentional or mistaken typographical errors as a result of its unedited utilization.

## 2016-07-29 NOTE — Telephone Encounter (Signed)
Message left

## 2016-08-13 ENCOUNTER — Encounter: Payer: Self-pay | Admitting: Family

## 2016-08-13 ENCOUNTER — Ambulatory Visit (INDEPENDENT_AMBULATORY_CARE_PROVIDER_SITE_OTHER): Payer: BLUE CROSS/BLUE SHIELD | Admitting: Family

## 2016-08-13 VITALS — BP 144/98 | HR 87 | Temp 98.3°F | Ht 68.0 in | Wt 203.0 lb

## 2016-08-13 DIAGNOSIS — J309 Allergic rhinitis, unspecified: Secondary | ICD-10-CM

## 2016-08-13 DIAGNOSIS — M797 Fibromyalgia: Secondary | ICD-10-CM

## 2016-08-13 DIAGNOSIS — I10 Essential (primary) hypertension: Secondary | ICD-10-CM | POA: Diagnosis not present

## 2016-08-13 DIAGNOSIS — R21 Rash and other nonspecific skin eruption: Secondary | ICD-10-CM

## 2016-08-13 DIAGNOSIS — R41840 Attention and concentration deficit: Secondary | ICD-10-CM | POA: Insufficient documentation

## 2016-08-13 DIAGNOSIS — Z23 Encounter for immunization: Secondary | ICD-10-CM

## 2016-08-13 DIAGNOSIS — M059 Rheumatoid arthritis with rheumatoid factor, unspecified: Secondary | ICD-10-CM

## 2016-08-13 LAB — COMPREHENSIVE METABOLIC PANEL
ALBUMIN: 4.5 g/dL (ref 3.5–5.2)
ALK PHOS: 42 U/L (ref 39–117)
ALT: 38 U/L — ABNORMAL HIGH (ref 0–35)
AST: 22 U/L (ref 0–37)
BILIRUBIN TOTAL: 0.5 mg/dL (ref 0.2–1.2)
BUN: 16 mg/dL (ref 6–23)
CO2: 25 mEq/L (ref 19–32)
CREATININE: 0.97 mg/dL (ref 0.40–1.20)
Calcium: 9.2 mg/dL (ref 8.4–10.5)
Chloride: 105 mEq/L (ref 96–112)
GFR: 68.59 mL/min (ref >60.00)
Glucose, Bld: 103 mg/dL — ABNORMAL HIGH (ref 70–99)
Potassium: 3.6 mEq/L (ref 3.5–5.1)
SODIUM: 137 meq/L (ref 135–145)
TOTAL PROTEIN: 7.2 g/dL (ref 6.0–8.3)

## 2016-08-13 MED ORDER — LISINOPRIL 10 MG PO TABS: 10.0000 mg | ORAL_TABLET | Freq: Every day | ORAL | 3 refills | Status: DC

## 2016-08-13 MED ORDER — FLUTICASONE PROPIONATE 50 MCG/ACT NA SUSP: 2.0000 | Freq: Every day | NASAL | 2 refills | Status: AC

## 2016-08-13 NOTE — Assessment & Plan Note (Signed)
At baseline. Follows with pain management.

## 2016-08-13 NOTE — Assessment & Plan Note (Signed)
Diagnosed as child with ADD and has been on Adderall in the past. Requesting new evaluation as would like medication. I placed a referral to psychology for this.

## 2016-08-13 NOTE — Assessment & Plan Note (Signed)
Starting low-dose of lisinopril today. Checking BMP. Closely follow blood pressure at follow-up. Advised patient to keep blood pressure cuff at home and let me know how she is doing on medication.

## 2016-08-13 NOTE — Patient Instructions (Signed)
My pleasure meeting you today. We will call you the results of your lab work. I look forward to seeing you for your physical exam.

## 2016-08-13 NOTE — Progress Notes (Signed)
Pre visit review using our clinic review tool, if applicable. No additional management support is needed unless otherwise documented below in the visit note. 

## 2016-08-13 NOTE — Assessment & Plan Note (Signed)
On methotrexate and actemra. Follows with rheumatologist. At baseline.

## 2016-08-13 NOTE — Progress Notes (Signed)
Subjective:    Patient ID: Loretta Blankenship, female    DOB: May 29, 1981, 35 y.o.   MRN: [id number]  CC: Loretta Blankenship is a 35 y.o. female who presents today as new patient.  HPI: Patient is here today to establish care as a new patient.  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   RA- Methotrexate. Low dose prednisone PRN. Uses TENS unit, CBT to control pain.   Tension HA- usually from RA. Typically starts in low neck and runs up low back. Once per week. No facial paralysis. Sees stars 'if really bad' and photophobia. Ibuprofen works for pain. Had been on Nucynta ( non opioid) in past.   Pain management- trying trigger point injections, on tramadol.   Depression/Anxiety- on cymbalta. Controlled. Has seen counselor in past.   Birth control- on Nexplanon and [facility]. Planning on IUD as no immediate plans for conception. Pap 05/2016 normal, negative HPV. H/o of pap + HPV.   Bug bite- right thigh 2 months ago.  Resolved. Showed picture on cell phone of red circle. RA has been worse since. No fever, chills.   HTN- Has been elevated lately. No chest pain.   Due for CPE. Influenza vaccine given today.      HISTORY:  Past Medical History:  Diagnosis Date  . Allergy 0717/17   allergies to cats and dogs  . Arthritis   . Asthma   . Depression   . GERD (gastroesophageal reflux disease)   . Hypertension 04/09/2016  . Neuromuscular disorder (HCC) 2010   fibromyalgia   Past Surgical History:  Procedure Laterality Date  . LAPAROSCOPIC OOPHERECTOMY Left 01/2015   dermoid cyst that ruptured the tube   Family History  Problem Relation Age of Onset  . Heart disease Mother   . Heart disease Father   . Arthritis/Rheumatoid Father   . Skin cancer Father     basal cell    Allergies: Rituxan [rituximab] Current Outpatient Prescriptions on File Prior to Visit  Medication Sig Dispense Refill  . Clobetasol Prop  Oint-Coal Tar (CLOBETAPLUS OINTMENT EX) Apply topically as needed.    . diclofenac sodium (VOLTAREN) 1 % GEL Apply topically 4 (four) times daily.    . DULoxetine (CYMBALTA) 60 MG capsule Take 60 mg by mouth daily.    Marland Kitchen etonogestrel (NEXPLANON) 68 MG IMPL implant 1 each by Subdermal route once.    . Methotrexate, PF, 7.5 MG/0.15ML SOAJ Inject into the skin once a week.    . Naproxen Sodium 220 MG CAPS Take 2 capsules by mouth 2 (two) times daily before a meal.    . predniSONE (DELTASONE) 10 MG tablet Take 10 mg by mouth daily with breakfast.    . Tocilizumab (ACTEMRA) 162 MG/0.9ML SOSY Inject into the skin once a week.    . traMADol (ULTRAM) 50 MG tablet Take 2 tablets (100 mg total) by mouth 3 (three) times daily. 120 tablet 1   Current Facility-Administered Medications on File Prior to Visit  Medication Dose Route Frequency Provider Last Rate Last Dose  . dexamethasone (DECADRON) injection 10 mg  10 mg Intra-articular Once Yevette Edwards, MD      . ropivacaine (PF) (NAROPIN) injection 15 mL  15 mL Epidural Once Yevette Edwards, MD        Social History  Substance Use Topics  . Smoking status: Never Smoker  . Smokeless tobacco: Never Used  . Alcohol use 0.0 oz/week  Comment: Occasional wine, beer    Review of Systems  Constitutional: Negative for chills, fever and unexpected weight change.  HENT: Negative for congestion.   Respiratory: Negative for cough.   Cardiovascular: Negative for chest pain, palpitations and leg swelling.  Gastrointestinal: Negative for nausea and vomiting.  Musculoskeletal: Positive for arthralgias. Negative for myalgias.  Skin: Positive for rash (resolved).  Neurological: Negative for headaches.  Hematological: Negative for adenopathy.  Psychiatric/Behavioral: Negative for confusion. The patient is nervous/anxious.       Objective:    BP (!) 144/98   Pulse 87   Temp 98.3 F (36.8 C) (Oral)   Ht 5\' 8"  (1.727 m)   Wt 203 lb (92.1 kg)   LMP  07/24/2016   BMI 30.87 kg/m  BP Readings from Last 3 Encounters:  08/13/16 (!) 144/98  07/28/16 (!) 161/93  07/15/16 (!) 147/80   Wt Readings from Last 3 Encounters:  08/13/16 203 lb (92.1 kg)  07/28/16 200 lb (90.7 kg)  07/15/16 195 lb (88.5 kg)    Physical Exam  Constitutional: She appears well-developed and well-nourished.  Eyes: Conjunctivae are normal.  Cardiovascular: Normal rate, regular rhythm, normal heart sounds and normal pulses.   Pulmonary/Chest: Effort normal and breath sounds normal. She has no wheezes. She has no rhonchi. She has no rales.  Neurological: She is alert.  Skin: Skin is warm and dry.  Psychiatric: She has a normal mood and affect. Her speech is normal and behavior is normal. Thought content normal.  Vitals reviewed.      Assessment & Plan:   Problem List Items Addressed This Visit      Cardiovascular and Mediastinum   Hypertension    Starting low-dose of lisinopril today. Checking BMP. Closely follow blood pressure at follow-up. Advised patient to keep blood pressure cuff at home and let me know how she is doing on medication.       Relevant Medications   lisinopril (PRINIVIL,ZESTRIL) 10 MG tablet   Other Relevant Orders   Comprehensive metabolic panel     Musculoskeletal and Integument   Fibromyalgia    At baseline. Follows with pain management.      Rheumatoid arthritis, seropositive (HCC)    On methotrexate and actemra. Follows with rheumatologist. At baseline.      Rash and nonspecific skin eruption    From picture on patient's cell phone rash on right medial thigh with erythematous however did not appreciate a bull's-eye rash. Due to patient's concern, we jointly decided we would draw Lyme titers today.      Relevant Orders   Lyme Ab/Western Blot Reflex     Other   Attention deficit    Diagnosed as child with ADD and has been on Adderall in the past. Requesting new evaluation as would like medication. I placed a referral to  psychology for this.      Relevant Orders   Ambulatory referral to Psychology    Other Visit Diagnoses    Allergic rhinitis, unspecified allergic rhinitis type    -  Primary   Relevant Medications   fluticasone (FLONASE) 50 MCG/ACT nasal spray       I have discontinued Ms. Jarvis tiZANidine and topiramate. I am also having her start on fluticasone and lisinopril. Additionally, I am having her maintain her DULoxetine, Tocilizumab, Methotrexate (PF), etonogestrel, diclofenac sodium, Naproxen Sodium, predniSONE, Clobetasol Prop Oint-Coal Tar (CLOBETAPLUS OINTMENT EX), and traMADol. We will continue to administer dexamethasone and ropivacaine (PF).   Meds ordered this encounter  Medications  . fluticasone (FLONASE) 50 MCG/ACT nasal spray    Sig: Place 2 sprays into both nostrils daily.    Dispense:  16 g    Refill:  2    Order Specific Question:   Supervising Provider    Answer:   Darrick Huntsman, TERESA L [[id number]]  . lisinopril (PRINIVIL,ZESTRIL) 10 MG tablet    Sig: Take 1 tablet (10 mg total) by mouth daily.    Dispense:  90 tablet    Refill:  3    Order Specific Question:   Supervising Provider    Answer:   Sherlene Shams [[id number]]    Return precautions given.   Risks, benefits, and alternatives of the medications and treatment plan prescribed today were discussed, and patient expressed understanding.   Education regarding symptom management and diagnosis given to patient on AVS.  Continue to follow with Rennie Plowman, FNP for routine health maintenance.   Chinita Pester and I agreed with plan.   Rennie Plowman, FNP

## 2016-08-13 NOTE — Assessment & Plan Note (Signed)
From picture on patient's cell phone rash on right medial thigh with erythematous however did not appreciate a bull's-eye rash. Due to patient's concern, we jointly decided we would draw Lyme titers today.

## 2016-08-14 LAB — LYME AB/WESTERN BLOT REFLEX: B burgdorferi Ab IgG+IgM: <0.9 Index (ref <0.90)

## 2016-08-20 ENCOUNTER — Telehealth: Payer: Self-pay

## 2016-08-20 NOTE — Telephone Encounter (Signed)
BP Reading @ 1200 today 142/85 and then 132/89 pulse for both 105. Patient has also been extremely tired.  Has Head ache and skin is also dry.  Please advise.

## 2016-08-21 NOTE — Telephone Encounter (Signed)
Patient has been informed to schedule and keep log.  Patient has not being having severe HA past couple days so she thinks she is fine at the moment. But will comply if SX worsen.

## 2016-08-21 NOTE — Telephone Encounter (Signed)
Ask patient to make a follow up appointment.   I am happy to see BP 132/89. The 142 is close to goal so I do not want to increase lisinopril quite yet.   Have patient keep a log of blood pressures.   If she is having a severe HA, she must go to ED.

## 2016-08-25 ENCOUNTER — Ambulatory Visit: Payer: BLUE CROSS/BLUE SHIELD | Attending: Anesthesiology | Admitting: Anesthesiology

## 2016-08-25 ENCOUNTER — Encounter: Payer: Self-pay | Admitting: Anesthesiology

## 2016-08-25 VITALS — BP 139/72 | HR 78 | Temp 98.7°F | Resp 16 | Ht 68.0 in | Wt 195.0 lb

## 2016-08-25 DIAGNOSIS — J45909 Unspecified asthma, uncomplicated: Secondary | ICD-10-CM | POA: Diagnosis not present

## 2016-08-25 DIAGNOSIS — M25511 Pain in right shoulder: Secondary | ICD-10-CM | POA: Diagnosis present

## 2016-08-25 DIAGNOSIS — M1991 Primary osteoarthritis, unspecified site: Secondary | ICD-10-CM | POA: Insufficient documentation

## 2016-08-25 DIAGNOSIS — F329 Major depressive disorder, single episode, unspecified: Secondary | ICD-10-CM | POA: Insufficient documentation

## 2016-08-25 DIAGNOSIS — K219 Gastro-esophageal reflux disease without esophagitis: Secondary | ICD-10-CM | POA: Insufficient documentation

## 2016-08-25 DIAGNOSIS — M5136 Other intervertebral disc degeneration, lumbar region: Secondary | ICD-10-CM | POA: Diagnosis not present

## 2016-08-25 DIAGNOSIS — G8929 Other chronic pain: Secondary | ICD-10-CM | POA: Insufficient documentation

## 2016-08-25 DIAGNOSIS — M25512 Pain in left shoulder: Secondary | ICD-10-CM | POA: Diagnosis present

## 2016-08-25 DIAGNOSIS — M150 Primary generalized (osteo)arthritis: Secondary | ICD-10-CM

## 2016-08-25 DIAGNOSIS — I10 Essential (primary) hypertension: Secondary | ICD-10-CM | POA: Insufficient documentation

## 2016-08-25 DIAGNOSIS — M797 Fibromyalgia: Secondary | ICD-10-CM | POA: Insufficient documentation

## 2016-08-25 DIAGNOSIS — M25569 Pain in unspecified knee: Secondary | ICD-10-CM | POA: Insufficient documentation

## 2016-08-25 DIAGNOSIS — M159 Polyosteoarthritis, unspecified: Secondary | ICD-10-CM

## 2016-08-25 DIAGNOSIS — M542 Cervicalgia: Secondary | ICD-10-CM | POA: Insufficient documentation

## 2016-08-25 MED ORDER — DEXAMETHASONE SODIUM PHOSPHATE 10 MG/ML IJ SOLN: 10.0000 mg | Freq: Once | INTRAMUSCULAR | Status: DC

## 2016-08-25 MED ORDER — ROPIVACAINE HCL 2 MG/ML IJ SOLN
INTRAMUSCULAR | Status: AC
  Filled 2016-08-25: qty 10

## 2016-08-25 MED ORDER — TOPIRAMATE 50 MG PO TABS: 50.0000 mg | ORAL_TABLET | Freq: Two times a day (BID) | ORAL | 1 refills | Status: AC

## 2016-08-25 MED ORDER — TRAMADOL HCL 50 MG PO TABS: 100.0000 mg | ORAL_TABLET | Freq: Three times a day (TID) | ORAL | 1 refills | Status: DC

## 2016-08-25 MED ORDER — ROPIVACAINE HCL 2 MG/ML IJ SOLN: 1.0000 mL | Freq: Once | INTRAMUSCULAR | Status: DC

## 2016-08-25 MED ORDER — DEXAMETHASONE SODIUM PHOSPHATE 10 MG/ML IJ SOLN
INTRAMUSCULAR | Status: AC
  Filled 2016-08-25: qty 1

## 2016-08-25 NOTE — Progress Notes (Signed)
Safety precautions to be maintained throughout the outpatient stay will include: orient to surroundings, keep bed in low position, maintain call bell within reach at all times, provide assistance with transfer out of bed and ambulation.  

## 2016-08-25 NOTE — Progress Notes (Signed)
Subjective:  Patient ID: Loretta Blankenship, female    DOB: 01-12-1981  Age: 35 y.o. MRN: [id number]  CC: Shoulder Pain (left and right ); Back Pain (low); Foot Pain (bilateral); and Hand Pain (bilateral)   Service Provided on Last Visit: Procedure (TPI)  PROCEDURE:#3Trigger point injection 2 to the Right mid body and proximal trapezius  HPI: [name] had trigger point injections 1 month ago. This was done to the trapezius musculature on the left side and she reports that she gain considerable relief. She feels less tender in this region and is experiencing less radiating pain. Today she reports a similar pain on the right side. Her pain frequently has been bilateral in nature but the right side is more aggravated at this time. She still has chronically diminished strength to the upper extremities. And this has not changed following her injection but the pain that she reports is diminished. She is taking her medications as prescribed. She reports that 2 tramadol 3 times a day isn't sufficient and that in the past she has taken Topamax 50 mg twice a day which has helped with her fibromyalgia symptoms and pain overnight. It has been well tolerated she reports. Furthermore she only rarely does her physical therapy stretching strengthening exercises secondary to pain.  History: Loretta Blankenship presents with a long-standing history greater than 10 years of bilateral shoulder and neck pain bilateral low back pain with radiation into both knees affecting both feet. She has been seen at a pain control Center and [facility] and these notes have been requested. She reports that she has been through a series of cervical epidural steroid injections with some relief of her cervical neck pain. She typically has radiation into the left upper extremity affecting the lateral fingers 34 and 5 and has associated diminished grip strength that is also noted to be bilateral. She also develops tension headaches and has been  treated for this. She also states that she has chronic low back pain that causes her legs to feel heavy and occasionally weak. She reports having had a previous workup with MRI of the neck and low back within the past 5-10 years but these are unavailable to me at this time. Furthermore she has had physical therapy and does some home exercises for her neck and back. She's been on Nucynta for her chronic pain and rheumatoid arthritis and has been followed previously by rheumatologic clinic and has just moved her care to current medical clinic for rheumatologic management.  In regards to her cervical neck pain she gets radiation and pain in the left posterior aspect of her elbow radiating over the forearm and into the lateral 3 fingers as mentioned. This is been one of her primary pain complaints. She also has profound tenderness and soreness in the bilateral trapezius muscles which is worse on the left side  She describes her pain condition as being associated with depression and fatigue and inability to concentrate causing numbness and tingling as mentioned in the hand and associated weakness with pain that wakes her up at night with the quality pain described as aching agonizing and annoying intermittent and occasionally sharp and shooting both in the left upper shoulder trapezius area with radiation in the hand and occasionally in the low back.  History [name] has a past medical history of Allergy (0717/17); Arthritis; Asthma; Depression; GERD (gastroesophageal reflux disease); Hypertension (04/09/2016); and Neuromuscular disorder (HCC) (2010).   She has a past surgical history that includes Laparoscopic oopherectomy (Left, 01/2015).  Her family history includes Arthritis/Rheumatoid in her father; Heart disease in her father and mother; Skin cancer in her father.She reports that she has never smoked. She has never used smokeless tobacco. She reports that she drinks alcohol. She reports that she does not  use drugs.  No results found for this or any previous visit.  ToxAssure Select 13  Date Value Ref Range Status  06/29/2016 FINAL  Final    Comment:    ==================================================================== TOXASSURE SELECT 13 (MW) ==================================================================== Test                             Result       Flag       Units Drug Present not Declared for Prescription Verification   Amphetamine                    332          UNEXPECTED ng/mg creat    Amphetamine is available as a schedule II prescription drug. ==================================================================== Test                      Result    Flag   Units      Ref Range   Creatinine              145              mg/dL      >=60 ==================================================================== Declared Medications:  The flagging and interpretation on this report are based on the  following declared medications.  Unexpected results may arise from  inaccuracies in the declared medications.  **Note: The testing scope of this panel does not include following  reported medications:  Diclofenac (Voltaren)  Duloxetine (Cymbalta)  Etonogestrel (Nexplanon)  Methotrexate  Naproxen  Prednisone (Deltasone)  Tizanidine (Zanaflex)  Tocilizumab  Topiramate (Topamax) ==================================================================== For clinical consultation, please call [phone]. ====================================================================     Outpatient Medications Prior to Visit  Medication Sig Dispense Refill  . Clobetasol Prop Oint-Coal Tar (CLOBETAPLUS OINTMENT EX) Apply topically as needed.    . diclofenac sodium (VOLTAREN) 1 % GEL Apply topically 4 (four) times daily.    . DULoxetine (CYMBALTA) 60 MG capsule Take 60 mg by mouth daily.    Marland Kitchen etonogestrel (NEXPLANON) 68 MG IMPL implant 1 each by Subdermal route once.    . fluticasone (FLONASE)  50 MCG/ACT nasal spray Place 2 sprays into both nostrils daily. 16 g 2  . lisinopril (PRINIVIL,ZESTRIL) 10 MG tablet Take 1 tablet (10 mg total) by mouth daily. 90 tablet 3  . Methotrexate, PF, 7.5 MG/0.15ML SOAJ Inject into the skin once a week.    . Naproxen Sodium 220 MG CAPS Take 2 capsules by mouth 2 (two) times daily before a meal.    . predniSONE (DELTASONE) 10 MG tablet Take 10 mg by mouth daily with breakfast.    . Tocilizumab (ACTEMRA) 162 MG/0.9ML SOSY Inject into the skin once a week.    . traMADol (ULTRAM) 50 MG tablet Take 2 tablets (100 mg total) by mouth 3 (three) times daily. 120 tablet 1   Facility-Administered Medications Prior to Visit  Medication Dose Route Frequency Provider Last Rate Last Dose  . dexamethasone (DECADRON) injection 10 mg  10 mg Intra-articular Once Yevette Edwards, MD      . ropivacaine (PF) (NAROPIN) injection 15 mL  15 mL Epidural Once Yevette Edwards, MD  Lab Results  Component Value Date   GLUCOSE 103 (H) 08/13/2016   ALT 38 (H) 08/13/2016   AST 22 08/13/2016   NA 137 08/13/2016   K 3.6 08/13/2016   CL 105 08/13/2016   CREATININE 0.97 08/13/2016   BUN 16 08/13/2016   CO2 25 08/13/2016    --------------------------------------------------------------------------------------------------------------------- Patient was never admitted.     ---------------------------------------------------------------------------------------------------------------------- Past Medical History:  Diagnosis Date  . Allergy 0717/17   allergies to cats and dogs  . Arthritis   . Asthma   . Depression   . GERD (gastroesophageal reflux disease)   . Hypertension 04/09/2016  . Neuromuscular disorder (HCC) 2010   fibromyalgia    Past Surgical History:  Procedure Laterality Date  . LAPAROSCOPIC OOPHERECTOMY Left 01/2015   dermoid cyst that ruptured the tube    Family History  Problem Relation Age of Onset  . Heart disease Mother   . Heart disease  Father   . Arthritis/Rheumatoid Father   . Skin cancer Father     basal cell    Social History  Substance Use Topics  . Smoking status: Never Smoker  . Smokeless tobacco: Never Used  . Alcohol use 0.0 oz/week     Comment: Occasional wine, beer    ---------------------------------------------------------------------------------------------------------------------- Social History   Social History  . Marital status: Unknown    Spouse name: N/A  . Number of children: N/A  . Years of education: N/A   Social History Main Topics  . Smoking status: Never Smoker  . Smokeless tobacco: Never Used  . Alcohol use 0.0 oz/week     Comment: Occasional wine, beer  . Drug use: No  . Sexual activity: Yes   Other Topics Concern  . None   Social History Narrative   Not currently working.    [employer]   Engaged. Planning to get married April 2018 in [place] with family and friends.      Enjoys Retail banker.      Exercise- plans to join [employer].                         BP 128/74 (BP Location: Left Arm, Patient Position: Sitting, Cuff Size: Large)   Pulse 96   Temp 98.7 F (37.1 C) (Oral)   Resp 16   Ht 5\' 8"  (1.727 m)   Wt 195 lb (88.5 kg)   LMP 07/24/2016   SpO2 96%   BMI 29.65 kg/m    BP Readings from Last 3 Encounters:  08/25/16 128/74  08/13/16 (!) 144/98  07/28/16 (!) 161/93     Wt Readings from Last 3 Encounters:  08/25/16 195 lb (88.5 kg)  08/13/16 203 lb (92.1 kg)  07/28/16 200 lb (90.7 kg)     ----------------------------------------------------------------------------------------------------------------------  ROS Review of Systems  Cardiac: Negative Pulmonary: Asthma bronchitis and snoring Neurologic: As above   Objective:  BP 128/74 (BP Location: Left Arm, Patient Position: Sitting, Cuff Size: Large)   Pulse 96   Temp 98.7 F (37.1 C) (Oral)   Resp 16   Ht 5\' 8"  (1.727 m)   Wt 195 lb (88.5 kg)   LMP 07/24/2016   SpO2 96%    BMI 29.65 kg/m   Physical Exam Patient is a very emotional 35 year old white female she is alert oriented cooperative but frequently very tearful during evaluation. Pupils are equally round reactive to light extraocular muscles intact Heart is regular rate and rhythm without murmur Lungs are clear to also dictation  no wheezes or rales Abdomen is soft and nontender The left trapezius muscle is less tender to palpation but there are 2 trigger points in the right mid body trapezius and right proximal trapezius  Assessment & Plan:   Kyarah was seen today for shoulder pain, back pain, foot pain and hand pain.  Diagnoses and all orders for this visit:  Cervicalgia -     Ambulatory referral to Physical Therapy -     dexamethasone (DECADRON) injection 10 mg; 1 mL (10 mg total) by Other route once. -     ropivacaine (PF) 2 mg/ml (0.2%) (NAROPIN) epidural 1 mL; 1 mL by Epidural route once.  Chronic pain -     dexamethasone (DECADRON) injection 10 mg; 1 mL (10 mg total) by Other route once. -     ropivacaine (PF) 2 mg/ml (0.2%) (NAROPIN) epidural 1 mL; 1 mL by Epidural route once.  Primary osteoarthritis involving multiple joints  DDD (degenerative disc disease), lumbar -     Ambulatory referral to Physical Therapy  Other orders -     ropivacaine (PF) 2 mg/ml (0.2%) (NAROPIN) 2 MG/ML epidural;  -     dexamethasone (DECADRON) 10 MG/ML injection;  -     traMADol (ULTRAM) 50 MG tablet; Take 2 tablets (100 mg total) by mouth 3 (three) times daily. -     topiramate (TOPAMAX) 50 MG tablet; Take 1 tablet (50 mg total) by mouth 2 (two) times daily.     ----------------------------------------------------------------------------------------------------------------------  Problem List Items Addressed This Visit    None    Visit Diagnoses    Cervicalgia    -  Primary   Relevant Medications   dexamethasone (DECADRON) injection 10 mg   ropivacaine (PF) 2 mg/ml (0.2%) (NAROPIN) epidural 1 mL    Other Relevant Orders   Ambulatory referral to Physical Therapy   Chronic pain       Relevant Medications   ropivacaine (PF) 2 mg/ml (0.2%) (NAROPIN) 2 MG/ML epidural   dexamethasone (DECADRON) 10 MG/ML injection   traMADol (ULTRAM) 50 MG tablet   topiramate (TOPAMAX) 50 MG tablet   dexamethasone (DECADRON) injection 10 mg   ropivacaine (PF) 2 mg/ml (0.2%) (NAROPIN) epidural 1 mL   Primary osteoarthritis involving multiple joints       Relevant Medications   dexamethasone (DECADRON) 10 MG/ML injection   traMADol (ULTRAM) 50 MG tablet   dexamethasone (DECADRON) injection 10 mg   DDD (degenerative disc disease), lumbar       Relevant Medications   dexamethasone (DECADRON) 10 MG/ML injection   traMADol (ULTRAM) 50 MG tablet   dexamethasone (DECADRON) injection 10 mg   Other Relevant Orders   Ambulatory referral to Physical Therapy      ----------------------------------------------------------------------------------------------------------------------  1. Chronic pain She does have symptoms consistent with fibromyalgia and we will write for Topamax 50 mg tablets 1 by mouth twice a day to see if this can be beneficial. She states that she's taking this the past and it was helpful in combination with the Ultram. She did ask for Nucynta but I have refused this at this point.  2. DDD (degenerative disc disease), lumbar Continue physical therapy exercises. She may ultimately be a candidate for a lumbar epidural steroid injection. 3. Cervicalgia  - TRIGGER POINT INJECTION; 2 ... I've gone over some exercises to assist with her cervical neck pain.  4. Primary osteoarthritis involving multiple joints Continue follow-up with her rheumatologist at [facility] clinic. We'll continue her on Ultram 50 mg tablets 1 or  2 tablets on a 2-3 times a day basis. She's been on Nucynta in the past with good relief of her generalized pain and we may consider this at her next evaluation and based on her  urine tox screen. - Ambulatory referral to Orthopedic Surgery  5. Knee pain, chronic, unspecified laterality  - Ambulatory referral to Orthopedic Surgery    ----------------------------------------------------------------------------------------------------------------------  I am having Ms. Gloeckner start on topiramate. I am also having her maintain her DULoxetine, Tocilizumab, Methotrexate (PF), etonogestrel, diclofenac sodium, Naproxen Sodium, predniSONE, Clobetasol Prop Oint-Coal Tar (CLOBETAPLUS OINTMENT EX), fluticasone, lisinopril, and traMADol. We will continue to administer dexamethasone, ropivacaine (PF), dexamethasone, and ropivacaine (PF) 2 mg/ml (0.2%).   Meds ordered this encounter  Medications  . ropivacaine (PF) 2 mg/ml (0.2%) (NAROPIN) 2 MG/ML epidural    Roney Mans L: cabinet override  . dexamethasone (DECADRON) 10 MG/ML injection    Roney Mans L: cabinet override  . traMADol (ULTRAM) 50 MG tablet    Sig: Take 2 tablets (100 mg total) by mouth 3 (three) times daily.    Dispense:  120 tablet    Refill:  1  . topiramate (TOPAMAX) 50 MG tablet    Sig: Take 1 tablet (50 mg total) by mouth 2 (two) times daily.    Dispense:  60 tablet    Refill:  1  . dexamethasone (DECADRON) injection 10 mg  . ropivacaine (PF) 2 mg/ml (0.2%) (NAROPIN) epidural 1 mL   :Trigger point injectionx 2: The area overlying the aforementioned trigger points were prepped with alcohol. They were then injected with a 25-gauge needle with  4cc of ropivacaine 0.2% and Decadron 4 mg at each site after negative aspiration for heme. This was performed after informed consent was obtained and risks and benefits reviewed. She tolerated this procedure without difficulty and was convalesced and discharged to home in stable condition for follow-up as mentioned.  @James  Pernell Dupre, MD@    Follow-up: Return for evaluation, procedure.    Yevette Edwards, MD  This dictation was performed utilizing Dragon  voice recognition software.  Please excuse any unintentional or mistaken typographical errors as a result of its unedited utilization.

## 2016-08-26 ENCOUNTER — Telehealth: Payer: Self-pay | Admitting: *Deleted

## 2016-08-26 NOTE — Telephone Encounter (Signed)
Message left

## 2016-09-14 ENCOUNTER — Encounter: Payer: Self-pay | Admitting: Family

## 2016-09-14 ENCOUNTER — Ambulatory Visit
Admission: RE | Admit: 2016-09-14 | Discharge: 2016-09-14 | Disposition: A | Discharge status: Home / Self Care | Payer: BLUE CROSS/BLUE SHIELD | Source: Ambulatory Visit | Attending: Family | Admitting: Family

## 2016-09-14 ENCOUNTER — Ambulatory Visit (INDEPENDENT_AMBULATORY_CARE_PROVIDER_SITE_OTHER): Payer: BLUE CROSS/BLUE SHIELD | Admitting: Family

## 2016-09-14 VITALS — BP 126/86 | HR 93 | Temp 98.1°F | Ht 68.0 in | Wt 201.0 lb

## 2016-09-14 DIAGNOSIS — M47816 Spondylosis without myelopathy or radiculopathy, lumbar region: Secondary | ICD-10-CM | POA: Diagnosis not present

## 2016-09-14 DIAGNOSIS — R1032 Left lower quadrant pain: Secondary | ICD-10-CM

## 2016-09-14 DIAGNOSIS — I10 Essential (primary) hypertension: Secondary | ICD-10-CM

## 2016-09-14 DIAGNOSIS — M797 Fibromyalgia: Secondary | ICD-10-CM

## 2016-09-14 DIAGNOSIS — R1013 Epigastric pain: Secondary | ICD-10-CM | POA: Diagnosis not present

## 2016-09-14 HISTORY — DX: Systemic involvement of connective tissue, unspecified: M35.9

## 2016-09-14 LAB — POCT URINE PREGNANCY: PREG TEST UR: NEGATIVE

## 2016-09-14 MED ORDER — DULOXETINE HCL 60 MG PO CPEP: 60.0000 mg | ORAL_CAPSULE | Freq: Every day | ORAL | 1 refills | Status: DC

## 2016-09-14 MED ORDER — IOPAMIDOL (ISOVUE-300) INJECTION 61%
100.0000 mL | Freq: Once | INTRAVENOUS | Status: AC | PRN
  Administered 2016-09-14: 100 mL via INTRAVENOUS

## 2016-09-14 MED ORDER — DULOXETINE HCL 60 MG PO CPEP: 60.0000 mg | ORAL_CAPSULE | Freq: Every day | ORAL | 1 refills | Status: AC

## 2016-09-14 NOTE — Progress Notes (Signed)
Pre visit review using our clinic review tool, if applicable. No additional management support is needed unless otherwise documented below in the visit note. 

## 2016-09-14 NOTE — Progress Notes (Signed)
Subjective:    Patient ID: Loretta Blankenship, female    DOB: 04-06-1981, 35 y.o.   MRN: [id number]  CC: Loretta Blankenship is a 35 y.o. female who presents today for follow up.   HPI: Patient here to follow up on chronic disease.   She also has new complaint of diarrhea, burping, increase gas, intermittent over past month. Waxing and wayning. Has been paying attention to what she has been eating and unable to identify triggers. No blood in stool.  Describes mild cramping. Occasional has epigastric pain. Pain not associated with meals or laying down with meals. Pain improves with eating.  No night sweats, unintentional weight loss. Takes ibuprofen.  On menstrual period now. H/o of dermoid cyst left ovary.  HTN- checking BP at home 130/78. No CP, SOB. Feels more tired than usual on lisinopril.   8/23 OV with rheumatology Dr. Su Hilt. Gave patient tramadol.  Continued on methotrexate, folic acid, and actemra.       HISTORY:  Past Medical History:  Diagnosis Date  . Allergy 0717/17   allergies to cats and dogs  . Arthritis   . Asthma   . Collagen vascular disease (HCC)   . Depression   . GERD (gastroesophageal reflux disease)   . Hypertension 04/09/2016  . Neuromuscular disorder (HCC) 2010   fibromyalgia   Past Surgical History:  Procedure Laterality Date  . LAPAROSCOPIC OOPHERECTOMY Left 01/2015   dermoid cyst that ruptured the tube   Family History  Problem Relation Age of Onset  . Heart disease Mother   . Heart disease Father   . Arthritis/Rheumatoid Father   . Skin cancer Father     basal cell    Allergies: Rituxan [rituximab] Current Outpatient Prescriptions on File Prior to Visit  Medication Sig Dispense Refill  . Clobetasol Prop Oint-Coal Tar (CLOBETAPLUS OINTMENT EX) Apply topically as needed.    . diclofenac sodium (VOLTAREN) 1 % GEL Apply topically 4 (four) times daily.    Marland Kitchen etonogestrel (NEXPLANON) 68 MG IMPL implant 1 each by Subdermal route once.    .  fluticasone (FLONASE) 50 MCG/ACT nasal spray Place 2 sprays into both nostrils daily. 16 g 2  . lisinopril (PRINIVIL,ZESTRIL) 10 MG tablet Take 1 tablet (10 mg total) by mouth daily. 90 tablet 3  . Methotrexate, PF, 7.5 MG/0.15ML SOAJ Inject into the skin once a week.    . Naproxen Sodium 220 MG CAPS Take 2 capsules by mouth 2 (two) times daily before a meal.    . predniSONE (DELTASONE) 10 MG tablet Take 10 mg by mouth daily with breakfast.    . Tocilizumab (ACTEMRA) 162 MG/0.9ML SOSY Inject into the skin once a week.    . topiramate (TOPAMAX) 50 MG tablet Take 1 tablet (50 mg total) by mouth 2 (two) times daily. 60 tablet 1   Current Facility-Administered Medications on File Prior to Visit  Medication Dose Route Frequency Provider Last Rate Last Dose  . dexamethasone (DECADRON) injection 10 mg  10 mg Intra-articular Once Yevette Edwards, MD      . dexamethasone (DECADRON) injection 10 mg  10 mg Other Once Yevette Edwards, MD      . ropivacaine (PF) (NAROPIN) injection 15 mL  15 mL Epidural Once Yevette Edwards, MD      . ropivacaine (PF) 2 mg/ml (0.2%) (NAROPIN) epidural 1 mL  1 mL Epidural Once Yevette Edwards, MD        Social History  Substance Use Topics  .  Smoking status: Never Smoker  . Smokeless tobacco: Never Used  . Alcohol use 0.0 oz/week     Comment: Occasional wine, beer    Review of Systems  Constitutional: Negative for chills, fever and unexpected weight change.  HENT: Negative for trouble swallowing.   Respiratory: Negative for cough.   Cardiovascular: Negative for chest pain and palpitations.  Gastrointestinal: Positive for diarrhea. Negative for abdominal distention, abdominal pain, blood in stool, constipation, nausea and vomiting.  Genitourinary: Negative for dysuria.  Neurological: Positive for headaches (chronic). Negative for dizziness.      Objective:    BP 126/86   Pulse 93   Temp 98.1 F (36.7 C) (Oral)   Ht 5\' 8"  (1.727 m)   Wt 201 lb (91.2 kg)   LMP  09/13/2016 (Exact Date)   SpO2 98%   BMI 30.56 kg/m  BP Readings from Last 3 Encounters:  09/14/16 126/86  08/25/16 139/72  08/13/16 (!) 144/98   Wt Readings from Last 3 Encounters:  09/14/16 201 lb (91.2 kg)  08/25/16 195 lb (88.5 kg)  08/13/16 203 lb (92.1 kg)    Physical Exam  Constitutional: She appears well-developed and well-nourished.  Eyes: Conjunctivae are normal.  Cardiovascular: Normal rate, regular rhythm, normal heart sounds and normal pulses.   Pulmonary/Chest: Effort normal and breath sounds normal. She has no wheezes. She has no rhonchi. She has no rales.  Abdominal: Soft. Normal appearance and bowel sounds are normal. She exhibits no distension, no fluid wave, no ascites and no mass. There is tenderness in the left lower quadrant. There is no rigidity, no rebound, no guarding, no tenderness at McBurney's point and negative Murphy's sign.  Neurological: She is alert.  Skin: Skin is warm and dry.  Psychiatric: She has a normal mood and affect. Her speech is normal and behavior is normal. Thought content normal.  Vitals reviewed.      Assessment & Plan:   Problem List Items Addressed This Visit      Cardiovascular and Mediastinum   Hypertension    At goal. Discussed continued  lifestyle modifications. Patient prefers to stay at current dose of lisinopril as fatigue has improved while on medication. We will continue to discuss new side effects of medications however will maintain regimen for now.         Other   Fibromyalgia    Stable. Had reported good control on cymbalta from her prior PCP. She would like to continue this medication. I advised that I would refill only if patient discontinued tramadol from her rheumatologist due to strong drug interaction and risk of serotonin syndrome. Patient understood this concern and will stop tramadol.       Relevant Medications   DULoxetine (CYMBALTA) 60 MG capsule   Abdominal pain, epigastric - Primary    Working  diagnosis of GERD. Patient will start back on prilosec ( she has existing rx at home). Referral to GI for possible endoscopy. Patient symptoms and h/o NSAID use concerning for ulcer. Advised to avoid alcohol and NSAIDs.       Relevant Orders   Ambulatory referral to Gastroenterology   CT Abdomen Pelvis W Contrast (Completed)   POCT urine pregnancy (Completed)   Basic metabolic panel   CBC with Differential/Platelet   Lipase, blood   LLQ pain    On exam, LLQ pain. Patient reports sensitivity from left oophorectomy however she winced and appeared to be very tender. Pending labs and CT abdomen and pelvis to evaluate for diverticulitis.  Other Visit Diagnoses   None.      I have discontinued Ms. Dacquisto traMADol. I am also having her maintain her Tocilizumab, Methotrexate (PF), etonogestrel, diclofenac sodium, Naproxen Sodium, predniSONE, Clobetasol Prop Oint-Coal Tar (CLOBETAPLUS OINTMENT EX), fluticasone, lisinopril, topiramate, and DULoxetine. We will continue to administer dexamethasone, ropivacaine (PF), dexamethasone, and ropivacaine (PF) 2 mg/ml (0.2%).   Meds ordered this encounter  Medications  . DISCONTD: DULoxetine (CYMBALTA) 60 MG capsule    Sig: Take 1 capsule (60 mg total) by mouth daily.    Dispense:  90 capsule    Refill:  1    Order Specific Question:   Supervising Provider    Answer:   Darrick Huntsman, TERESA L [[id number]]  . DULoxetine (CYMBALTA) 60 MG capsule    Sig: Take 1 capsule (60 mg total) by mouth daily.    Dispense:  90 capsule    Refill:  1    Return precautions given.   Risks, benefits, and alternatives of the medications and treatment plan prescribed today were discussed, and patient expressed understanding.   Education regarding symptom management and diagnosis given to patient on AVS.  Continue to follow with Rennie Plowman, FNP for routine health maintenance.   Chinita Pester and I agreed with plan.   Rennie Plowman, FNP

## 2016-09-14 NOTE — Patient Instructions (Signed)
Pending eval of abdomen. If pain acutely worsens over night, please go to ED.   Please start prilosec again.   If there is no improvement in your symptoms, or if there is any worsening of symptoms, or if you have any additional concerns, please return for re-evaluation; or, if we are closed, consider going to the Emergency Room for evaluation if symptoms urgent.

## 2016-09-14 NOTE — Assessment & Plan Note (Addendum)
At goal. Discussed continued  lifestyle modifications. Patient prefers to stay at current dose of lisinopril as fatigue has improved while on medication. We will continue to discuss new side effects of medications however will maintain regimen for now.

## 2016-09-14 NOTE — Assessment & Plan Note (Addendum)
Working diagnosis of GERD. Patient will start back on prilosec ( she has existing rx at home). Referral to GI for possible endoscopy. Patient symptoms and h/o NSAID use concerning for ulcer. Advised to avoid alcohol and NSAIDs.

## 2016-09-14 NOTE — Assessment & Plan Note (Signed)
Stable. Had reported good control on cymbalta from her prior PCP. She would like to continue this medication. I advised that I would refill only if patient discontinued tramadol from her rheumatologist due to strong drug interaction and risk of serotonin syndrome. Patient understood this concern and will stop tramadol.

## 2016-09-14 NOTE — Assessment & Plan Note (Addendum)
On exam, LLQ pain. Patient reports sensitivity from left oophorectomy however she winced and appeared to be very tender. Pending labs and CT abdomen and pelvis to evaluate for diverticulitis.

## 2016-09-15 ENCOUNTER — Encounter: Payer: Self-pay | Admitting: Physician Assistant

## 2016-09-30 ENCOUNTER — Ambulatory Visit (INDEPENDENT_AMBULATORY_CARE_PROVIDER_SITE_OTHER): Payer: BLUE CROSS/BLUE SHIELD | Admitting: Physician Assistant

## 2016-09-30 ENCOUNTER — Other Ambulatory Visit (INDEPENDENT_AMBULATORY_CARE_PROVIDER_SITE_OTHER): Payer: BLUE CROSS/BLUE SHIELD

## 2016-09-30 ENCOUNTER — Encounter: Payer: Self-pay | Admitting: Physician Assistant

## 2016-09-30 VITALS — BP 118/80 | HR 84 | Ht 68.0 in | Wt 202.0 lb

## 2016-09-30 DIAGNOSIS — K2970 Gastritis, unspecified, without bleeding: Secondary | ICD-10-CM | POA: Diagnosis not present

## 2016-09-30 DIAGNOSIS — R109 Unspecified abdominal pain: Secondary | ICD-10-CM | POA: Diagnosis not present

## 2016-09-30 DIAGNOSIS — R197 Diarrhea, unspecified: Secondary | ICD-10-CM

## 2016-09-30 DIAGNOSIS — K2990 Gastroduodenitis, unspecified, without bleeding: Secondary | ICD-10-CM | POA: Diagnosis not present

## 2016-09-30 LAB — COMPREHENSIVE METABOLIC PANEL
ALK PHOS: 42 U/L (ref 39–117)
ALT: 47 U/L — ABNORMAL HIGH (ref 0–35)
AST: 22 U/L (ref 0–37)
Albumin: 4.2 g/dL (ref 3.5–5.2)
BILIRUBIN TOTAL: 0.6 mg/dL (ref 0.2–1.2)
BUN: 15 mg/dL (ref 6–23)
CALCIUM: 9.2 mg/dL (ref 8.4–10.5)
CO2: 25 meq/L (ref 19–32)
Chloride: 105 mEq/L (ref 96–112)
Creatinine, Ser: 0.86 mg/dL (ref 0.40–1.20)
GFR: 78.76 mL/min (ref >60.00)
GLUCOSE: 106 mg/dL — AB (ref 70–99)
POTASSIUM: 4.2 meq/L (ref 3.5–5.1)
Sodium: 136 mEq/L (ref 135–145)
Total Protein: 6.9 g/dL (ref 6.0–8.3)

## 2016-09-30 LAB — SEDIMENTATION RATE: Sed Rate: 1 mm/hr (ref 0–20)

## 2016-09-30 LAB — CBC WITH DIFFERENTIAL/PLATELET
BASOS PCT: 0.4 % (ref 0.0–3.0)
Basophils Absolute: 0 10*3/uL (ref 0.0–0.1)
EOS PCT: 2.1 % (ref 0.0–5.0)
Eosinophils Absolute: 0.2 10*3/uL (ref 0.0–0.7)
HEMATOCRIT: 44.9 % (ref 36.0–46.0)
Hemoglobin: 15.6 g/dL — ABNORMAL HIGH (ref 12.0–15.0)
LYMPHS ABS: 1.8 10*3/uL (ref 0.7–4.0)
LYMPHS PCT: 24.6 % (ref 12.0–46.0)
MCHC: 34.8 g/dL (ref 30.0–36.0)
MCV: 90.6 fl (ref 78.0–100.0)
MONOS PCT: 10 % (ref 3.0–12.0)
Monocytes Absolute: 0.7 10*3/uL (ref 0.1–1.0)
NEUTROS ABS: 4.5 10*3/uL (ref 1.4–7.7)
NEUTROS PCT: 62.9 % (ref 43.0–77.0)
PLATELETS: 229 10*3/uL (ref 150.0–400.0)
RBC: 4.96 Mil/uL (ref 3.87–5.11)
RDW: 13.2 % (ref 11.5–15.5)
WBC: 7.2 10*3/uL (ref 4.0–10.5)

## 2016-09-30 MED ORDER — DICYCLOMINE HCL 10 MG PO CAPS: ORAL_CAPSULE | ORAL | 1 refills | Status: DC

## 2016-09-30 NOTE — Patient Instructions (Signed)
Please go to the basement level to have your labs drawn.  Decrease use of Naproxen and Ibuoprofen.   Increase Prilosec to 20 mg twice daily.

## 2016-09-30 NOTE — Progress Notes (Signed)
Subjective:    Patient ID: Loretta Blankenship, female    DOB: Nov 15, 1981, 35 y.o.   MRN: [id number]  HPI Loretta Blankenship  is a pleasant 35 year old white female, new to GI today referred by Rennie Plowman FNP [facility] for evaluation of epigastric pain and diarrhea. Patient has history of rheumatoid arthritis and fibromyalgia. She has not had any prior GI evaluation. She did undergo CT of the abdomen and pelvis on 09/14/2016 with IV contrast which was a negative study. She relates about 6 week history of epigastric pain burning, a churning" sensation in her abdomen and periodic diarrhea. She's not had any fever or chills no melena or hematochezia. She had been using NSAIDs on a regular basis, usually at least 6 ibuprofen per day for  pain issues from her rheumatoid arthritis. She was started on Prilosec 20 mg by mouth daily about 2 weeks ago and says that some of her symptoms have improved with decrease in epigastric burning and nausea but continues to have some diarrhea. She says at worst she was having 6-8 bowel movements per day and is now having 2-3 bowel movements per day. He had not been started on any new medications recently and no recent antibiotics. He has not really had problems with ongoing diarrhea in the past  Review of Systems Pertinent positive and negative review of systems were noted in the above HPI section.  All other review of systems was otherwise negative.  Outpatient Encounter Prescriptions as of 09/30/2016  Medication Sig  . Clobetasol Prop Oint-Coal Tar (CLOBETAPLUS OINTMENT EX) Apply topically as needed.  . diclofenac sodium (VOLTAREN) 1 % GEL Apply topically 4 (four) times daily.  . DULoxetine (CYMBALTA) 60 MG capsule Take 1 capsule (60 mg total) by mouth daily.  Marland Kitchen etonogestrel (NEXPLANON) 68 MG IMPL implant 1 each by Subdermal route once.  . fluticasone (FLONASE) 50 MCG/ACT nasal spray Place 2 sprays into both nostrils daily.  Marland Kitchen ibuprofen (ADVIL,MOTRIN) 200 MG  tablet Take 600 mg by mouth every 6 (six) hours as needed.  Marland Kitchen lisinopril (PRINIVIL,ZESTRIL) 10 MG tablet Take 1 tablet (10 mg total) by mouth daily.  . Methotrexate, PF, 7.5 MG/0.15ML SOAJ Inject into the skin once a week.  Marland Kitchen omeprazole (PRILOSEC) 20 MG capsule Take 20 mg by mouth daily.  . predniSONE (DELTASONE) 10 MG tablet Take 10 mg by mouth daily with breakfast. Only uses for arthritis flares  . Tocilizumab (ACTEMRA) 162 MG/0.9ML SOSY Inject into the skin once a week.  . topiramate (TOPAMAX) 50 MG tablet Take 1 tablet (50 mg total) by mouth 2 (two) times daily.  . traMADol (ULTRAM) 50 MG tablet Take 100 mg by mouth 3 (three) times daily.  Marland Kitchen dicyclomine (BENTYL) 10 MG capsule Take 1 tab twice daily as needed for cramping, abdominal pain, spasms.  . [DISCONTINUED] Naproxen Sodium 220 MG CAPS Take 2 capsules by mouth 2 (two) times daily before a meal.   Facility-Administered Encounter Medications as of 09/30/2016  Medication  . dexamethasone (DECADRON) injection 10 mg  . dexamethasone (DECADRON) injection 10 mg  . ropivacaine (PF) (NAROPIN) injection 15 mL  . ropivacaine (PF) 2 mg/ml (0.2%) (NAROPIN) epidural 1 mL   Allergies  Allergen Reactions  . Rituxan [Rituximab] Shortness Of Breath    Shuts down her lungs   Patient Active Problem List   Diagnosis Date Noted  . Abdominal pain, epigastric 09/14/2016  . LLQ pain 09/14/2016  . Rash and nonspecific skin eruption 08/13/2016  . Attention deficit 08/13/2016  .  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 Fibromyalgia 04/09/2016  . Rheumatoid arthritis, seropositive (HCC) 04/09/2016  . Hypertension 04/09/2016   Social History   Social History  . Marital status: Single    Spouse name: N/A  . Number of children: N/A  . Years of education: N/A   Occupational History  . Not on file.   Social History Main Topics  . Smoking status: Never Smoker  . Smokeless tobacco: Never Used  . Alcohol use 0.0 oz/week      Comment: Occasional wine, beer  . Drug use: No  . Sexual activity: Yes   Other Topics Concern  . Not on file   Social History Narrative   Not currently working.    [employer]   Engaged. Planning to get married April 2018 in [place] with family and friends.      Enjoys Retail banker.      Exercise- plans to join [employer].                         Ms. Gaydos family history includes Arthritis/Rheumatoid in her father; Heart disease in her father and mother; Skin cancer in her father.      Objective:    Vitals:   09/30/16 0900  BP: 118/80  Pulse: 84    Physical Exam  well-developed white female in no acute distress, pleasant blood pressure 118/80 pulse 84, BMI 30.7. HEENT; nontraumatic normocephalic EOMI PERRLA sclera anicteric, Cardiovascular ;regular rate and rhythm with S1-S2 no murmur or gallop, Pulmonary; clear bilaterally, Abdomen ;soft, she has some mild tenderness in the right mid and right lower quadrant there is no guarding or rebound no palpable mass or hepatosplenomegaly bowel sounds present, Rectal ;exam not done, Ext; no clubbing cyanosis or edema skin warm dry, Neuropsych ;mood and affect appropriate       Assessment & Plan:   #36 35 year old white female 6 week history of epigastric pain burning, dyspepsia and diarrhea. All symptoms improved over the past 2 weeks on Prilosec but not completely resolved. I suspect she has had an NSAID-induced gastropathy. Also consider an infectious gastroenteritis as she is also on immunosuppressive therapy. Fortunately she has been improving. #2 rheumatoid arthritis #3 fibromyalgia #4 ADD  Plan; CBC with differential, CMET and sedimentation rate, GI path panel Increase Prilosec to 20 mg by mouth twice a day 1 month Add Bentyl 10 mg by mouth twice a day Strongly encouraged her to significantly decrease her NSAID use no more than 2 per day and only is absolutely necessary. She is awaiting an appointment to get  established with a pain clinic. We'll plan follow-up office visit in 4-6 weeks. Patient will be established with Dr. Christella Hartigan.   Cionna Collantes Oswald Hillock PA-C 09/30/2016   Cc: Allegra Grana, FNP

## 2016-09-30 NOTE — Progress Notes (Signed)
I agree with the above note, plan 

## 2016-10-12 ENCOUNTER — Telehealth: Payer: Self-pay | Admitting: *Deleted

## 2016-10-12 NOTE — Telephone Encounter (Signed)
Spoke with patient re; medications and GI upset.  Patient would like appt to come in and discuss medication management. Patient is taking ibuprofen or Aleve with Tramadol to help it work better.  Patient scheduled 10/15/16 @ 1500.

## 2016-10-14 ENCOUNTER — Other Ambulatory Visit: Payer: Self-pay

## 2016-10-14 DIAGNOSIS — I10 Essential (primary) hypertension: Secondary | ICD-10-CM

## 2016-10-14 MED ORDER — LISINOPRIL 10 MG PO TABS: 10.0000 mg | ORAL_TABLET | Freq: Every day | ORAL | 3 refills | Status: AC

## 2016-10-15 ENCOUNTER — Encounter: Payer: Self-pay | Admitting: Anesthesiology

## 2016-10-15 ENCOUNTER — Ambulatory Visit: Payer: BLUE CROSS/BLUE SHIELD | Attending: Anesthesiology | Admitting: Anesthesiology

## 2016-10-15 VITALS — BP 127/86 | HR 90 | Temp 98.5°F | Resp 16 | Ht 68.0 in | Wt 200.0 lb

## 2016-10-15 DIAGNOSIS — M542 Cervicalgia: Secondary | ICD-10-CM | POA: Diagnosis not present

## 2016-10-15 DIAGNOSIS — M25569 Pain in unspecified knee: Secondary | ICD-10-CM | POA: Insufficient documentation

## 2016-10-15 DIAGNOSIS — M25559 Pain in unspecified hip: Secondary | ICD-10-CM | POA: Diagnosis not present

## 2016-10-15 DIAGNOSIS — F329 Major depressive disorder, single episode, unspecified: Secondary | ICD-10-CM | POA: Insufficient documentation

## 2016-10-15 DIAGNOSIS — M79672 Pain in left foot: Secondary | ICD-10-CM | POA: Insufficient documentation

## 2016-10-15 DIAGNOSIS — G709 Myoneural disorder, unspecified: Secondary | ICD-10-CM | POA: Insufficient documentation

## 2016-10-15 DIAGNOSIS — G8929 Other chronic pain: Secondary | ICD-10-CM | POA: Diagnosis not present

## 2016-10-15 DIAGNOSIS — M797 Fibromyalgia: Secondary | ICD-10-CM | POA: Diagnosis not present

## 2016-10-15 DIAGNOSIS — M25519 Pain in unspecified shoulder: Secondary | ICD-10-CM | POA: Diagnosis not present

## 2016-10-15 DIAGNOSIS — M5136 Other intervertebral disc degeneration, lumbar region: Secondary | ICD-10-CM

## 2016-10-15 DIAGNOSIS — K219 Gastro-esophageal reflux disease without esophagitis: Secondary | ICD-10-CM | POA: Diagnosis not present

## 2016-10-15 DIAGNOSIS — M25529 Pain in unspecified elbow: Secondary | ICD-10-CM | POA: Insufficient documentation

## 2016-10-15 DIAGNOSIS — M79671 Pain in right foot: Secondary | ICD-10-CM | POA: Diagnosis not present

## 2016-10-15 DIAGNOSIS — J45909 Unspecified asthma, uncomplicated: Secondary | ICD-10-CM | POA: Insufficient documentation

## 2016-10-15 DIAGNOSIS — M79643 Pain in unspecified hand: Secondary | ICD-10-CM | POA: Insufficient documentation

## 2016-10-15 DIAGNOSIS — G894 Chronic pain syndrome: Secondary | ICD-10-CM

## 2016-10-15 DIAGNOSIS — M069 Rheumatoid arthritis, unspecified: Secondary | ICD-10-CM | POA: Insufficient documentation

## 2016-10-15 DIAGNOSIS — I10 Essential (primary) hypertension: Secondary | ICD-10-CM | POA: Diagnosis not present

## 2016-10-15 DIAGNOSIS — M159 Polyosteoarthritis, unspecified: Secondary | ICD-10-CM

## 2016-10-15 DIAGNOSIS — M1991 Primary osteoarthritis, unspecified site: Secondary | ICD-10-CM | POA: Insufficient documentation

## 2016-10-15 DIAGNOSIS — M150 Primary generalized (osteo)arthritis: Secondary | ICD-10-CM | POA: Diagnosis not present

## 2016-10-15 MED ORDER — TAPENTADOL HCL 50 MG PO TABS: 50.0000 mg | ORAL_TABLET | Freq: Two times a day (BID) | ORAL | 0 refills | Status: DC

## 2016-10-15 NOTE — Progress Notes (Signed)
Subjective:  Patient ID: Loretta Blankenship, female    DOB: [birth year]  Age: 35 y.o. MRN: [id number]  CC: Foot Pain (bilateral); Knee Pain (bilateral); Hip Pain (right); Hand Pain (bilateral); Elbow Pain; and Shoulder Pain   Service Provided on Last Visit: Procedure  PROCEDURE  None Previous procedure: #3Trigger point injection 2 to the Right mid body and proximal trapezius  HPI: Loretta Blankenship had trigger point injections 1 month ago. This was done to the trapezius musculature on the left side and she reports that she gain considerable relief. She feels less tender in this region and is experiencing less radiating pain. She continues to complain of neck pain of the same quality but slightly improved following the injections with less spasming however she continues to have diffuse body pain affecting her ankles low back and hips on par with her rheumatologic arthritis. In the past she has been on Nucynta and this has kept her pain under good control and improved her overall level of functioning. She also reports that the Ultram made her nauseated and gave her no relief from the pain that is reported.  History: Loretta Blankenship presents with a long-standing history greater than 10 years of bilateral shoulder and neck pain bilateral low back pain with radiation into both knees affecting both feet. She has been seen at a pain control Center and [facility] and these notes have been requested. She reports that she has been through a series of cervical epidural steroid injections with some relief of her cervical neck pain. She typically has radiation into the left upper extremity affecting the lateral fingers 34 and 5 and has associated diminished grip strength that is also noted to be bilateral. She also develops tension headaches and has been treated for this. She also states that she has chronic low back pain that causes her legs to feel heavy and occasionally weak. She reports having had a previous workup with  MRI of the neck and low back within the past 5-10 years but these are unavailable to me at this time. Furthermore she has had physical therapy and does some home exercises for her neck and back. She's been on Nucynta for her chronic pain and rheumatoid arthritis and has been followed previously by rheumatologic clinic and has just moved her care to current medical clinic for rheumatologic management.  In regards to her cervical neck pain she gets radiation and pain in the left posterior aspect of her elbow radiating over the forearm and into the lateral 3 fingers as mentioned. This is been one of her primary pain complaints. She also has profound tenderness and soreness in the bilateral trapezius muscles which is worse on the left side  She describes her pain condition as being associated with depression and fatigue and inability to concentrate causing numbness and tingling as mentioned in the hand and associated weakness with pain that wakes her up at night with the quality pain described as aching agonizing and annoying intermittent and occasionally sharp and shooting both in the left upper shoulder trapezius area with radiation in the hand and occasionally in the low back.  History Loretta Blankenship has a past medical history of Allergy (0717/17); Arthritis; Arthritis, rheumatoid (HCC); Asthma; Collagen vascular disease (HCC); Depression; GERD (gastroesophageal reflux disease); Hypertension (04/09/2016); and Neuromuscular disorder (HCC) (2010).   She has a past surgical history that includes Laparoscopic oopherectomy (Left, 01/2015).   Her family history includes Arthritis/Rheumatoid in her father; Heart disease in her father and mother; Skin cancer in her  father.She reports that she has never smoked. She has never used smokeless tobacco. She reports that she drinks alcohol. She reports that she does not use drugs.  No results found for this or any previous visit.  ToxAssure Select 13  Date Value Ref Range  Status  06/29/2016 FINAL  Final    Comment:    ==================================================================== TOXASSURE SELECT 13 (MW) ==================================================================== Test                             Result       Flag       Units Drug Present not Declared for Prescription Verification   Amphetamine                    332          UNEXPECTED ng/mg creat    Amphetamine is available as a schedule II prescription drug. ==================================================================== Test                      Result    Flag   Units      Ref Range   Creatinine              145              mg/dL      >=16>=20 ==================================================================== Declared Medications:  The flagging and interpretation on this report are based on the  following declared medications.  Unexpected results may arise from  inaccuracies in the declared medications.  **Note: The testing scope of this panel does not include following  reported medications:  Diclofenac (Voltaren)  Duloxetine (Cymbalta)  Etonogestrel (Nexplanon)  Methotrexate  Naproxen  Prednisone (Deltasone)  Tizanidine (Zanaflex)  Tocilizumab  Topiramate (Topamax) ==================================================================== For clinical consultation, please call [phone]. ====================================================================     Outpatient Medications Prior to Visit  Medication Sig Dispense Refill  . Clobetasol Prop Oint-Coal Tar (CLOBETAPLUS OINTMENT EX) Apply topically as needed.    . diclofenac sodium (VOLTAREN) 1 % GEL Apply topically 4 (four) times daily.    Marland Kitchen. dicyclomine (BENTYL) 10 MG capsule Take 1 tab twice daily as needed for cramping, abdominal pain, spasms. 60 capsule 1  . DULoxetine (CYMBALTA) 60 MG capsule Take 1 capsule (60 mg total) by mouth daily. 90 capsule 1  . etonogestrel (NEXPLANON) 68 MG IMPL implant 1 each by  Subdermal route once.    . fluticasone (FLONASE) 50 MCG/ACT nasal spray Place 2 sprays into both nostrils daily. 16 g 2  . ibuprofen (ADVIL,MOTRIN) 200 MG tablet Take 600 mg by mouth every 6 (six) hours as needed.    Marland Kitchen. lisinopril (PRINIVIL,ZESTRIL) 10 MG tablet Take 1 tablet (10 mg total) by mouth daily. 90 tablet 3  . Methotrexate, PF, 7.5 MG/0.15ML SOAJ Inject into the skin once a week.    Marland Kitchen. omeprazole (PRILOSEC) 20 MG capsule Take 20 mg by mouth 2 (two) times daily before a meal.     . predniSONE (DELTASONE) 10 MG tablet Take 10 mg by mouth daily with breakfast. Only uses for arthritis flares    . Tocilizumab (ACTEMRA) 162 MG/0.9ML SOSY Inject into the skin once a week.    . topiramate (TOPAMAX) 50 MG tablet Take 1 tablet (50 mg total) by mouth 2 (two) times daily. 60 tablet 1  . traMADol (ULTRAM) 50 MG tablet Take 100 mg by mouth 3 (three) times daily.  0   Facility-Administered Medications Prior to  Visit  Medication Dose Route Frequency Provider Last Rate Last Dose  . dexamethasone (DECADRON) injection 10 mg  10 mg Intra-articular Once Yevette Edwards, MD      . dexamethasone (DECADRON) injection 10 mg  10 mg Other Once Yevette Edwards, MD      . ropivacaine (PF) (NAROPIN) injection 15 mL  15 mL Epidural Once Yevette Edwards, MD      . ropivacaine (PF) 2 mg/ml (0.2%) (NAROPIN) epidural 1 mL  1 mL Epidural Once Yevette Edwards, MD       Lab Results  Component Value Date   WBC 7.2 09/30/2016   HGB 15.6 (H) 09/30/2016   HCT 44.9 09/30/2016   PLT 229.0 09/30/2016   GLUCOSE 106 (H) 09/30/2016   ALT 47 (H) 09/30/2016   AST 22 09/30/2016   NA 136 09/30/2016   K 4.2 09/30/2016   CL 105 09/30/2016   CREATININE 0.86 09/30/2016   BUN 15 09/30/2016   CO2 25 09/30/2016    --------------------------------------------------------------------------------------------------------------------- Patient was never  admitted.     ---------------------------------------------------------------------------------------------------------------------- Past Medical History:  Diagnosis Date  . Allergy 0717/17   allergies to cats and dogs  . Arthritis   . Arthritis, rheumatoid (HCC)   . Asthma   . Collagen vascular disease (HCC)   . Depression   . GERD (gastroesophageal reflux disease)   . Hypertension 04/09/2016  . Neuromuscular disorder (HCC) 2010   fibromyalgia    Past Surgical History:  Procedure Laterality Date  . LAPAROSCOPIC OOPHERECTOMY Left 01/2015   dermoid cyst that ruptured the tube    Family History  Problem Relation Age of Onset  . Heart disease Mother   . Heart disease Father   . Arthritis/Rheumatoid Father   . Skin cancer Father     basal cell  . Colon cancer Neg Hx   . Stomach cancer Neg Hx     Social History  Substance Use Topics  . Smoking status: Never Smoker  . Smokeless tobacco: Never Used  . Alcohol use 0.0 oz/week     Comment: Occasional wine, beer    ---------------------------------------------------------------------------------------------------------------------- Social History   Social History  . Marital status: Single    Spouse name: N/A  . Number of children: N/A  . Years of education: N/A   Social History Main Topics  . Smoking status: Never Smoker  . Smokeless tobacco: Never Used  . Alcohol use 0.0 oz/week     Comment: Occasional wine, beer  . Drug use: No  . Sexual activity: Yes   Other Topics Concern  . None   Social History Narrative   Not currently working.    [employer]   Engaged. Planning to get married April 2018 in [place] with family and friends.      Enjoys Retail banker.      Exercise- plans to join [employer].                         BP 127/86   Pulse 90   Temp 98.5 F (36.9 C) (Oral)   Resp 16   Ht 5\' 8"  (1.727 m)   Wt 200 lb (90.7 kg)   LMP 09/18/2016   SpO2 98%   BMI 30.41 kg/m    BP  Readings from Last 3 Encounters:  10/15/16 127/86  09/30/16 118/80  09/14/16 126/86     Wt Readings from Last 3 Encounters:  10/15/16 200 lb (90.7 kg)  09/30/16 202 lb (91.6  kg)  09/14/16 201 lb (91.2 kg)     ----------------------------------------------------------------------------------------------------------------------  ROS Review of Systems  Cardiac: Negative Pulmonary: Asthma bronchitis and snoring Neurologic: As above   Objective:  BP 127/86   Pulse 90   Temp 98.5 F (36.9 C) (Oral)   Resp 16   Ht 5\' 8"  (1.727 m)   Wt 200 lb (90.7 kg)   LMP 09/18/2016   SpO2 98%   BMI 30.41 kg/m   Physical Exam Patient is alert oriented cooperative compliant Pupils are equally round reactive to light extraocular muscles intact Heart is regular rate and rhythm without murmur Lungs are clear to also dictation She is ambulating well with good balance and normal lower extremity tone and bulk.  Assessment & Plan:   Loretta Blankenship was seen today for foot pain, knee pain, hip pain, hand pain, elbow pain and shoulder pain.  Diagnoses and all orders for this visit:  Cervicalgia  Chronic pain syndrome  Primary osteoarthritis involving multiple joints  DDD (degenerative disc disease), lumbar  Other orders -     tapentadol (NUCYNTA) 50 MG tablet; Take 1 tablet (50 mg total) by mouth 2 (two) times daily.     ----------------------------------------------------------------------------------------------------------------------  Problem List Items Addressed This Visit    None    Visit Diagnoses    Cervicalgia    -  Primary   Chronic pain syndrome       Primary osteoarthritis involving multiple joints       Relevant Medications   tapentadol (NUCYNTA) 50 MG tablet   DDD (degenerative disc disease), lumbar       Relevant Medications   tapentadol (NUCYNTA) 50 MG tablet       ----------------------------------------------------------------------------------------------------------------------  1. Chronic pain She does have symptoms consistent with fibromyalgia and we will write for Topamax 50 mg tablets 1 by mouth twice a day to see if this can be beneficial. The Ultram did not help and based on her past history and favorable response to Nucynta I will initiate therapy 50 mg twice a day. We've had a long discussion of possible side effects associated with chronic opioid therapy as well as the therapeutic benefits. She appears well informed and desires to proceed accordingly. She is to return to clinic in 1 month for reevaluation.  2. DDD (degenerative disc disease), lumbar Continue physical therapy exercises. She may ultimately be a candidate for a lumbar epidural steroid injection. 3. Cervicalgia  4. Primary osteoarthritis involving multiple joints Continue follow-up with her rheumatologist at [facility].   5. Knee pain, chronic, unspecified laterality  - Ambulatory referral to Orthopedic Surgery    ----------------------------------------------------------------------------------------------------------------------  I am having Ms. Robideau start on tapentadol. I am also having her maintain her Tocilizumab, Methotrexate (PF), etonogestrel, diclofenac sodium, predniSONE, Clobetasol Prop Oint-Coal Tar (CLOBETAPLUS OINTMENT EX), fluticasone, topiramate, DULoxetine, omeprazole, ibuprofen, traMADol, dicyclomine, and lisinopril. We will continue to administer dexamethasone, ropivacaine (PF), dexamethasone, and ropivacaine (PF) 2 mg/ml (0.2%).   Meds ordered this encounter  Medications  . tapentadol (NUCYNTA) 50 MG tablet    Sig: Take 1 tablet (50 mg total) by mouth 2 (two) times daily.    Dispense:  60 tablet    Refill:  0   Follow-up: Return in about 1 month (around 11/14/2016) for evaluation, med refill procedure- trigger point shoulder.    14/01/2016, MD  This dictation was performed utilizing Dragon voice recognition software.  Please excuse any unintentional or mistaken typographical errors as a result of its unedited utilization.

## 2016-10-15 NOTE — Patient Instructions (Signed)
Trigger Point Injection Trigger points are areas where you have muscle pain. A trigger point injection is a shot given in the trigger point to relieve that pain. A trigger point might feel like a knot in your muscle. It hurts to press on a trigger point. Sometimes the pain spreads out (radiates) to other parts of the body. For example, pressing on a trigger point in your shoulder might cause pain in your arm or neck. You might have one trigger point. Or, you might have more than one. People often have trigger points in their upper back and lower back. They also occur often in the neck and shoulders. Pain from a trigger point lasts for a long time. It can make it hard to keep moving. You might not be able to do the exercise or physical therapy that could help you deal with the pain. A trigger point injection may help. It does not work for everyone. But, it may relieve your pain for a few days or a few months. A trigger point injection does not cure long-lasting (chronic) pain. LET YOUR CAREGIVER KNOW ABOUT:  Any allergies (especially to latex, lidocaine, or steroids).  Blood-thinning medicines that you take. These drugs can lead to bleeding or bruising after an injection. They include:  Aspirin.  Ibuprofen.  Clopidogrel.  Warfarin.  Other medicines you take. This includes all vitamins, herbs, eyedrops, over-the-counter medicines, and creams.  Use of steroids.  Recent infections.  Past problems with numbing medicines.  Bleeding problems.  Surgeries you have had.  Other health problems. RISKS AND COMPLICATIONS A trigger point injection is a safe treatment. However, problems may develop, such as:  Minor side effects usually go away in 1 to 2 days. These may include:  Soreness.  Bruising.  Stiffness.  More serious problems are rare. But, they may include:  Bleeding under the skin (hematoma).  Skin infection.  Breaking off of the needle under your skin.  Lung  puncture.  The trigger point injection may not work for you. BEFORE THE PROCEDURE You may need to stop taking any medicine that thins your blood. This is to prevent bleeding and bruising. Usually these medicines are stopped several days before the injection. No other preparation is needed. PROCEDURE  A trigger point injection can be given in your caregiver's office or in a clinic. Each injection takes 2 minutes or less.  Your caregiver will feel for trigger points. The caregiver may use a marker to circle the area for the injection.  The skin over the trigger point will be washed with a germ-killing (antiseptic) solution.  The caregiver pinches the spot for the injection.  Then, a very thin needle is used for the shot. You may feel pain or a twitching feeling when the needle enters the trigger point.  A numbing solution may be injected into the trigger point. Sometimes a drug to keep down swelling, redness, and warmth (inflammation) is also injected.  Your caregiver moves the needle around the trigger zone until the tightness and twitching goes away.  After the injection, your caregiver may put gentle pressure over the injection site.  Then it is covered with a bandage. AFTER THE PROCEDURE  You can go right home after the injection.  The bandage can be taken off after a few hours.  You may feel sore and stiff for 1 to 2 days.  Go back to your regular activities slowly. Your caregiver may ask you to stretch your muscles. Do not do anything that takes   extra energy for a few days.  Follow your caregiver's instructions to manage and treat other pain.   This information is not intended to replace advice given to you by your health care provider. Make sure you discuss any questions you have with your health care provider.   Document Released: 11/19/2011 Document Revised: 03/27/2013 Document Reviewed: 11/19/2011 Elsevier Interactive Patient Education 2016 [employer] Pain  Management Discharge Instructions  General Discharge Instructions :  If you need to reach your doctor call: Monday-Friday 8:00 am - 4:00 pm at [phone] or toll free [phone].  After clinic hours [phone] to have operator reach doctor.  Bring all of your medication bottles to all your appointments in the pain clinic.  To cancel or reschedule your appointment with Pain Management please remember to call 24 hours in advance to avoid a fee.  Refer to the educational materials which you have been given on: General Risks, I had my Procedure. Discharge Instructions, Post Sedation.  Post Procedure Instructions:  The drugs you were given will stay in your system until tomorrow, so for the next 24 hours you should not drive, make any legal decisions or drink any alcoholic beverages.  You may eat anything you prefer, but it is better to start with liquids then soups and crackers, and gradually work up to solid foods.  Please notify your doctor immediately if you have any unusual bleeding, trouble breathing or pain that is not related to your normal pain.  Depending on the type of procedure that was done, some parts of your body may feel week and/or numb.  This usually clears up by tonight or the next day.  Walk with the use of an assistive device or accompanied by an adult for the 24 hours.  You may use ice on the affected area for the first 24 hours.  Put ice in a Ziploc bag and cover with a towel and place against area 15 minutes on 15 minutes off.  You may switch to heat after 24 hours. 

## 2016-10-15 NOTE — Progress Notes (Signed)
Safety precautions to be maintained throughout the outpatient stay will include: orient to surroundings, keep bed in low position, maintain call bell within reach at all times, provide assistance with transfer out of bed and ambulation.  

## 2016-11-02 ENCOUNTER — Ambulatory Visit: Payer: BLUE CROSS/BLUE SHIELD | Admitting: Physician Assistant

## 2016-11-17 ENCOUNTER — Encounter: Payer: Self-pay | Admitting: Anesthesiology

## 2016-11-17 ENCOUNTER — Ambulatory Visit: Payer: BLUE CROSS/BLUE SHIELD | Attending: Anesthesiology | Admitting: Anesthesiology

## 2016-11-17 VITALS — BP 136/85 | HR 76 | Temp 98.7°F | Resp 16 | Ht 68.0 in | Wt 201.0 lb

## 2016-11-17 DIAGNOSIS — M25512 Pain in left shoulder: Secondary | ICD-10-CM | POA: Diagnosis present

## 2016-11-17 DIAGNOSIS — I10 Essential (primary) hypertension: Secondary | ICD-10-CM | POA: Diagnosis not present

## 2016-11-17 DIAGNOSIS — M797 Fibromyalgia: Secondary | ICD-10-CM | POA: Diagnosis not present

## 2016-11-17 DIAGNOSIS — M150 Primary generalized (osteo)arthritis: Secondary | ICD-10-CM

## 2016-11-17 DIAGNOSIS — K219 Gastro-esophageal reflux disease without esophagitis: Secondary | ICD-10-CM | POA: Insufficient documentation

## 2016-11-17 DIAGNOSIS — M25569 Pain in unspecified knee: Secondary | ICD-10-CM | POA: Insufficient documentation

## 2016-11-17 DIAGNOSIS — M5136 Other intervertebral disc degeneration, lumbar region: Secondary | ICD-10-CM | POA: Insufficient documentation

## 2016-11-17 DIAGNOSIS — M069 Rheumatoid arthritis, unspecified: Secondary | ICD-10-CM | POA: Diagnosis not present

## 2016-11-17 DIAGNOSIS — F329 Major depressive disorder, single episode, unspecified: Secondary | ICD-10-CM | POA: Insufficient documentation

## 2016-11-17 DIAGNOSIS — M542 Cervicalgia: Secondary | ICD-10-CM

## 2016-11-17 DIAGNOSIS — M1991 Primary osteoarthritis, unspecified site: Secondary | ICD-10-CM | POA: Insufficient documentation

## 2016-11-17 DIAGNOSIS — G8929 Other chronic pain: Secondary | ICD-10-CM | POA: Diagnosis not present

## 2016-11-17 DIAGNOSIS — G894 Chronic pain syndrome: Secondary | ICD-10-CM

## 2016-11-17 DIAGNOSIS — M159 Polyosteoarthritis, unspecified: Secondary | ICD-10-CM

## 2016-11-17 MED ORDER — TAPENTADOL HCL 50 MG PO TABS: 50.0000 mg | ORAL_TABLET | Freq: Two times a day (BID) | ORAL | 0 refills | Status: DC

## 2016-11-17 MED ORDER — TIZANIDINE HCL 4 MG PO CAPS: 4.0000 mg | ORAL_CAPSULE | Freq: Once | ORAL | 0 refills | Status: AC

## 2016-11-17 MED ORDER — ROPIVACAINE HCL 2 MG/ML IJ SOLN
INTRAMUSCULAR | Status: AC
  Administered 2016-11-17: 15:00:00
  Filled 2016-11-17: qty 10

## 2016-11-17 MED ORDER — DEXAMETHASONE SODIUM PHOSPHATE 4 MG/ML IJ SOLN
INTRAMUSCULAR | Status: AC
  Administered 2016-11-17: 15:00:00
  Filled 2016-11-17: qty 2

## 2016-11-17 NOTE — Progress Notes (Signed)
Safety precautions to be maintained throughout the outpatient stay will include: orient to surroundings, keep bed in low position, maintain call bell within reach at all times, provide assistance with transfer out of bed and ambulation.  

## 2016-11-17 NOTE — Patient Instructions (Signed)

## 2016-11-17 NOTE — Progress Notes (Signed)
Subjective:  Patient ID: Loretta Blankenship, female    DOB: Feb 15, 1981  Age: 35 y.o. MRN: [id number]  CC: Shoulder Pain (left) and Joint Pain (rheumatoid arthritis)   Service Provided on Last Visit: Med Refill  PROCEDURE  Left mid body trapezius trigger point 1 Previous procedure: #3Trigger point injection 2 to the Right mid body and proximal trapezius  HPI: Loretta Blankenship presents for reevaluation. She was last seen approximately a month ago. She did not require an injection at that time but reports that she's having left side trapezius spasming. The right side appears to be responding favorably to the effects of the previous injections her medication management as well as her physical therapy activities. The quality characteristic and distribution of the left side spasming is consistent with what she has had on the right previously. Injections have given her improvement in her sleep and pain management during the day. She still taking Nucynta 50 mg for diffuse body pain. She's been on this for long-standing. Initially started by her [place] pain management clinic. This is been well tolerated with no significant problems based on her narcotic assessment sheet. She denies diverting or illicit use. She is doing her stretching strengthening cervical exercises. She is inquiring about a tens unit application with physical therapy today.   History: Loretta Blankenship presents with a long-standing history greater than 10 years of bilateral shoulder and neck pain bilateral low back pain with radiation into both knees affecting both feet. She has been seen at a pain control Center and [facility] and these notes have been requested. She reports that she has been through a series of cervical epidural steroid injections with some relief of her cervical neck pain. She typically has radiation into the left upper extremity affecting the lateral fingers 34 and 5 and has associated diminished grip strength that is also noted  to be bilateral. She also develops tension headaches and has been treated for this. She also states that she has chronic low back pain that causes her legs to feel heavy and occasionally weak. She reports having had a previous workup with MRI of the neck and low back within the past 5-10 years but these are unavailable to me at this time. Furthermore she has had physical therapy and does some home exercises for her neck and back. She's been on Nucynta for her chronic pain and rheumatoid arthritis and has been followed previously by rheumatologic clinic and has just moved her care to current medical clinic for rheumatologic management.  In regards to her cervical neck pain she gets radiation and pain in the left posterior aspect of her elbow radiating over the forearm and into the lateral 3 fingers as mentioned. This is been one of her primary pain complaints. She also has profound tenderness and soreness in the bilateral trapezius muscles which is worse on the left side  She describes her pain condition as being associated with depression and fatigue and inability to concentrate causing numbness and tingling as mentioned in the hand and associated weakness with pain that wakes her up at night with the quality pain described as aching agonizing and annoying intermittent and occasionally sharp and shooting both in the left upper shoulder trapezius area with radiation in the hand and occasionally in the low back.  History Loretta Blankenship has a past medical history of Allergy (0717/17); Arthritis; Arthritis, rheumatoid (HCC); Asthma; Collagen vascular disease (HCC); Depression; GERD (gastroesophageal reflux disease); Hypertension (04/09/2016); and Neuromuscular disorder (HCC) (2010).   She has a past  surgical history that includes Laparoscopic oopherectomy (Left, 01/2015).   Her family history includes Arthritis/Rheumatoid in her father; Heart disease in her father and mother; Skin cancer in her father.She reports  that she has never smoked. She has never used smokeless tobacco. She reports that she drinks alcohol. She reports that she does not use drugs.  No results found for this or any previous visit.  ToxAssure Select 13  Date Value Ref Range Status  06/29/2016 FINAL  Final    Comment:    ==================================================================== TOXASSURE SELECT 13 (MW) ==================================================================== Test                             Result       Flag       Units Drug Present not Declared for Prescription Verification   Amphetamine                    332          UNEXPECTED ng/mg creat    Amphetamine is available as a schedule II prescription drug. ==================================================================== Test                      Result    Flag   Units      Ref Range   Creatinine              145              mg/dL      >=94 ==================================================================== Declared Medications:  The flagging and interpretation on this report are based on the  following declared medications.  Unexpected results may arise from  inaccuracies in the declared medications.  **Note: The testing scope of this panel does not include following  reported medications:  Diclofenac (Voltaren)  Duloxetine (Cymbalta)  Etonogestrel (Nexplanon)  Methotrexate  Naproxen  Prednisone (Deltasone)  Tizanidine (Zanaflex)  Tocilizumab  Topiramate (Topamax) ==================================================================== For clinical consultation, please call [phone]. ====================================================================     Outpatient Medications Prior to Visit  Medication Sig Dispense Refill  . Clobetasol Prop Oint-Coal Tar (CLOBETAPLUS OINTMENT EX) Apply topically as needed.    . diclofenac sodium (VOLTAREN) 1 % GEL Apply topically 4 (four) times daily.    . DULoxetine (CYMBALTA) 60 MG capsule Take  1 capsule (60 mg total) by mouth daily. 90 capsule 1  . etonogestrel (NEXPLANON) 68 MG IMPL implant 1 each by Subdermal route once.    . fluticasone (FLONASE) 50 MCG/ACT nasal spray Place 2 sprays into both nostrils daily. 16 g 2  . ibuprofen (ADVIL,MOTRIN) 200 MG tablet Take 600 mg by mouth every 6 (six) hours as needed.    Marland Kitchen lisinopril (PRINIVIL,ZESTRIL) 10 MG tablet Take 1 tablet (10 mg total) by mouth daily. 90 tablet 3  . Methotrexate, PF, 7.5 MG/0.15ML SOAJ Inject into the skin once a week.    Marland Kitchen omeprazole (PRILOSEC) 20 MG capsule Take 20 mg by mouth 2 (two) times daily before a meal.     . predniSONE (DELTASONE) 10 MG tablet Take 10 mg by mouth daily with breakfast. Only uses for arthritis flares    . Tocilizumab (ACTEMRA) 162 MG/0.9ML SOSY Inject into the skin once a week.    . topiramate (TOPAMAX) 50 MG tablet Take 1 tablet (50 mg total) by mouth 2 (two) times daily. 60 tablet 1  . tapentadol (NUCYNTA) 50 MG tablet Take 1 tablet (50 mg total) by mouth 2 (two)  times daily. 60 tablet 0  . dicyclomine (BENTYL) 10 MG capsule Take 1 tab twice daily as needed for cramping, abdominal pain, spasms. (Patient not taking: Reported on 11/17/2016) 60 capsule 1  . traMADol (ULTRAM) 50 MG tablet Take 100 mg by mouth 3 (three) times daily.  0   Facility-Administered Medications Prior to Visit  Medication Dose Route Frequency Provider Last Rate Last Dose  . dexamethasone (DECADRON) injection 10 mg  10 mg Intra-articular Once Yevette Edwards, MD      . dexamethasone (DECADRON) injection 10 mg  10 mg Other Once Yevette Edwards, MD      . ropivacaine (PF) (NAROPIN) injection 15 mL  15 mL Epidural Once Yevette Edwards, MD      . ropivacaine (PF) 2 mg/ml (0.2%) (NAROPIN) epidural 1 mL  1 mL Epidural Once Yevette Edwards, MD       Lab Results  Component Value Date   WBC 7.2 09/30/2016   HGB 15.6 (H) 09/30/2016   HCT 44.9 09/30/2016   PLT 229.0 09/30/2016   GLUCOSE 106 (H) 09/30/2016   ALT 47 (H) 09/30/2016    AST 22 09/30/2016   NA 136 09/30/2016   K 4.2 09/30/2016   CL 105 09/30/2016   CREATININE 0.86 09/30/2016   BUN 15 09/30/2016   CO2 25 09/30/2016    --------------------------------------------------------------------------------------------------------------------- Patient was never admitted.     ---------------------------------------------------------------------------------------------------------------------- Past Medical History:  Diagnosis Date  . Allergy 0717/17   allergies to cats and dogs  . Arthritis   . Arthritis, rheumatoid (HCC)   . Asthma   . Collagen vascular disease (HCC)   . Depression   . GERD (gastroesophageal reflux disease)   . Hypertension 04/09/2016  . Neuromuscular disorder (HCC) 2010   fibromyalgia    Past Surgical History:  Procedure Laterality Date  . LAPAROSCOPIC OOPHERECTOMY Left 01/2015   dermoid cyst that ruptured the tube    Family History  Problem Relation Age of Onset  . Heart disease Mother   . Heart disease Father   . Arthritis/Rheumatoid Father   . Skin cancer Father     basal cell  . Colon cancer Neg Hx   . Stomach cancer Neg Hx     Social History  Substance Use Topics  . Smoking status: Never Smoker  . Smokeless tobacco: Never Used  . Alcohol use 0.0 oz/week     Comment: Occasional wine, beer    ---------------------------------------------------------------------------------------------------------------------- Social History   Social History  . Marital status: Single    Spouse name: N/A  . Number of children: N/A  . Years of education: N/A   Social History Main Topics  . Smoking status: Never Smoker  . Smokeless tobacco: Never Used  . Alcohol use 0.0 oz/week     Comment: Occasional wine, beer  . Drug use: No  . Sexual activity: Yes   Other Topics Concern  . None   Social History Narrative   Not currently working.    [employer]   Engaged. Planning to get married April 2018 in [place] with  family and friends.      Enjoys Retail banker.      Exercise- plans to join [employer].                         BP 131/68 (BP Location: Left Arm, Patient Position: Sitting, Cuff Size: Large)   Pulse 77   Temp 98.7 F (37.1 C) (Oral)   Resp  16   Ht 5\' 8"  (1.727 m)   Wt 201 lb (91.2 kg)   LMP 10/15/2016   SpO2 100%   BMI 30.56 kg/m    BP Readings from Last 3 Encounters:  11/17/16 131/68  10/15/16 127/86  09/30/16 118/80     Wt Readings from Last 3 Encounters:  11/17/16 201 lb (91.2 kg)  10/15/16 200 lb (90.7 kg)  09/30/16 202 lb (91.6 kg)     ----------------------------------------------------------------------------------------------------------------------  ROS Review of Systems  Cardiac: No chest pain or shortness of breath Pulmonary: Asthma bronchitis and snoring Neurologic: As above GI: No constipation   Objective:  BP 131/68 (BP Location: Left Arm, Patient Position: Sitting, Cuff Size: Large)   Pulse 77   Temp 98.7 F (37.1 C) (Oral)   Resp 16   Ht 5\' 8"  (1.727 m)   Wt 201 lb (91.2 kg)   LMP 10/15/2016   SpO2 100%   BMI 30.56 kg/m   Physical Exam Patient is alert oriented cooperative compliant Pupils are equally round reactive to light extraocular muscles intact Heart is regular rate and rhythm without murmur She has a trigger point in the left mid body trapezius and appears to be less tender on the right side with good range of motion at the glenohumeral joint on both sides and good muscle tone and bulk  Assessment & Plan:   Loretta Blankenship was seen today for shoulder pain and joint pain.  Diagnoses and all orders for this visit:  Cervicalgia  Chronic pain syndrome  Primary osteoarthritis involving multiple joints  Other orders -     ropivacaine (PF) 2 mg/mL (0.2%) (NAROPIN) 2 MG/ML injection;  -     dexamethasone (DECADRON) 4 MG/ML injection;  -     tapentadol (NUCYNTA) 50 MG tablet; Take 1 tablet (50 mg total) by mouth 2 (two) times  daily. -     tiZANidine (ZANAFLEX) 4 MG capsule; Take 1 capsule (4 mg total) by mouth once.     ----------------------------------------------------------------------------------------------------------------------  Problem List Items Addressed This Visit    None    Visit Diagnoses    Cervicalgia    -  Primary   Chronic pain syndrome       Primary osteoarthritis involving multiple joints       Relevant Medications   dexamethasone (DECADRON) 4 MG/ML injection (Completed)   tapentadol (NUCYNTA) 50 MG tablet   tiZANidine (ZANAFLEX) 4 MG capsule      ----------------------------------------------------------------------------------------------------------------------  1. Chronic pain She does have symptoms consistent with fibromyalgia and we will write for Topamax 50 mg tablets 1 by mouth twice a day to see if this can be beneficial. The Nucynta appears to be well received and is keeping her pain under good control. Ultram previously failed. She is being seen by the physical therapy clinic and we will request a TENS application for her shoulder and neck pain. We will also proceed with a left side trigger point injection today to the trapezius. I'm also going to start her on tizanidine 4 mg tablets one at bedtime with a 90 day supply as this has worked well for her in the past as well.  2. DDD (degenerative disc disease), lumbar 3. Cervicalgia  4. Primary osteoarthritis involving multiple joints [facility]   5. Knee pain, chronic, unspecified laterality  - Ambulatory referral to Orthopedic Surgery    ----------------------------------------------------------------------------------------------------------------------  I am having Ms. Alyse LowKingan start on tiZANidine. I am also having her maintain her Tocilizumab, Methotrexate (  PF), etonogestrel, diclofenac sodium, predniSONE, Clobetasol Prop Oint-Coal Tar (CLOBETAPLUS OINTMENT EX),  fluticasone, topiramate, DULoxetine, omeprazole, ibuprofen, traMADol, dicyclomine, lisinopril, cholecalciferol, and tapentadol. We administered ropivacaine (PF) 2 mg/mL (0.2%) and dexamethasone. We will continue to administer dexamethasone, ropivacaine (PF) 5 mg/mL (0.5%), dexamethasone, and ropivacaine (PF) 2 mg/mL (0.2%).   Meds ordered this encounter  Medications  . cholecalciferol (VITAMIN D) 1000 units tablet    Sig: Take 5,000 Units by mouth.  . ropivacaine (PF) 2 mg/mL (0.2%) (NAROPIN) 2 MG/ML injection    Jarold Motto, Delores: cabinet override  . dexamethasone (DECADRON) 4 MG/ML injection    Jarold Motto, Delores: cabinet override  . tapentadol (NUCYNTA) 50 MG tablet    Sig: Take 1 tablet (50 mg total) by mouth 2 (two) times daily.    Dispense:  60 tablet    Refill:  0    Do not fill until [id number]  . tiZANidine (ZANAFLEX) 4 MG capsule    Sig: Take 1 capsule (4 mg total) by mouth once.    Dispense:  90 capsule    Refill:  0    90 day supply   Follow-up: Return for evaluation, med refill.  Left trapezius trigger point injection  Trigger point injection: The area overlying the aforementioned trigger points were prepped with alcohol. They were then injected with a 25-gauge needle with 8 cc of ropivacaine 0.2% and Decadron 8 mg at each site after negative aspiration for heme. This was performed after informed consent was obtained and risks and benefits reviewed. She tolerated this procedure without difficulty and was convalesced and discharged to home in stable condition for follow-up as mentioned.  @James  , MD@  Pernell Dupre, MD  This dictation was performed utilizing Dragon voice recognition software.  Please excuse any unintentional or mistaken typographical errors as a result of its unedited utilization.

## 2016-11-18 ENCOUNTER — Telehealth: Payer: Self-pay

## 2016-11-18 ENCOUNTER — Telehealth: Payer: Self-pay | Admitting: *Deleted

## 2016-11-18 NOTE — Telephone Encounter (Signed)
Faxed pts records to [facility] as she requested sent authorization and fax cover sheet to scan

## 2016-11-18 NOTE — Telephone Encounter (Signed)
LEFT VOICEMAIL FOR PATIENT TO RETURN CALL FOR ANY PROBLEMS POST PROCEDURE.

## 2016-12-22 ENCOUNTER — Ambulatory Visit: Payer: BLUE CROSS/BLUE SHIELD | Attending: Anesthesiology | Admitting: Anesthesiology

## 2016-12-22 ENCOUNTER — Encounter: Payer: Self-pay | Admitting: Anesthesiology

## 2016-12-22 VITALS — HR 105 | Temp 98.4°F | Resp 18 | Ht 68.0 in | Wt 203.0 lb

## 2016-12-22 DIAGNOSIS — Z8249 Family history of ischemic heart disease and other diseases of the circulatory system: Secondary | ICD-10-CM | POA: Insufficient documentation

## 2016-12-22 DIAGNOSIS — I10 Essential (primary) hypertension: Secondary | ICD-10-CM | POA: Insufficient documentation

## 2016-12-22 DIAGNOSIS — K219 Gastro-esophageal reflux disease without esophagitis: Secondary | ICD-10-CM | POA: Insufficient documentation

## 2016-12-22 DIAGNOSIS — M25569 Pain in unspecified knee: Secondary | ICD-10-CM | POA: Diagnosis not present

## 2016-12-22 DIAGNOSIS — G8929 Other chronic pain: Secondary | ICD-10-CM | POA: Insufficient documentation

## 2016-12-22 DIAGNOSIS — J45909 Unspecified asthma, uncomplicated: Secondary | ICD-10-CM | POA: Insufficient documentation

## 2016-12-22 DIAGNOSIS — M5136 Other intervertebral disc degeneration, lumbar region: Secondary | ICD-10-CM | POA: Diagnosis not present

## 2016-12-22 DIAGNOSIS — M069 Rheumatoid arthritis, unspecified: Secondary | ICD-10-CM | POA: Diagnosis not present

## 2016-12-22 DIAGNOSIS — M797 Fibromyalgia: Secondary | ICD-10-CM | POA: Insufficient documentation

## 2016-12-22 DIAGNOSIS — M542 Cervicalgia: Secondary | ICD-10-CM | POA: Diagnosis present

## 2016-12-22 DIAGNOSIS — M1990 Unspecified osteoarthritis, unspecified site: Secondary | ICD-10-CM | POA: Diagnosis not present

## 2016-12-22 DIAGNOSIS — F329 Major depressive disorder, single episode, unspecified: Secondary | ICD-10-CM | POA: Insufficient documentation

## 2016-12-22 DIAGNOSIS — G894 Chronic pain syndrome: Secondary | ICD-10-CM | POA: Insufficient documentation

## 2016-12-22 MED ORDER — TAPENTADOL HCL 50 MG PO TABS: 50.0000 mg | ORAL_TABLET | Freq: Two times a day (BID) | ORAL | 0 refills | Status: DC

## 2016-12-22 MED ORDER — TAPENTADOL HCL 50 MG PO TABS: 50.0000 mg | ORAL_TABLET | Freq: Two times a day (BID) | ORAL | 0 refills | Status: AC

## 2016-12-22 NOTE — Progress Notes (Signed)
Safety precautions to be maintained throughout the outpatient stay will include: orient to surroundings, keep bed in low position, maintain call bell within reach at all times, provide assistance with transfer out of bed and ambulation.  

## 2016-12-22 NOTE — Progress Notes (Signed)
Subjective:  Patient ID: Loretta Blankenship, female    DOB: 05-25-1981  Age: 36 y.o. MRN: [id number]  CC: Neck Pain (suprascapular bilaterally)   Service Provided on Last Visit: Procedure (TPI)  PROCEDURE  None Previous procedure :  Left mid body trapezius trigger point 1 Previous procedure: #3Trigger point injection 2 to the Right mid body and proximal trapezius  HPI: Loretta Blankenship presents for reevaluation. She maintains that she is doing well. The severity of her pain has diminished and the trigger point injections appear to be helping. Furthermore she is trying to do her physical therapy exercises on a more routine basis and these seem to be helping as well. She recently traveled to [place] and tolerated the car ride well. No changes in the quality characteristic or distribution of her pain are noted in her upper extremity strength remains at baseline. She is tolerating her medications well and using the Nucynta up to twice a day. No side effects are noted based on her narcotic assessment sheet and she has been compliant based on the practitioner  database reviewed today.  History: Loretta Blankenship presents with a long-standing history greater than 10 years of bilateral shoulder and neck pain bilateral low back pain with radiation into both knees affecting both feet. She has been seen at a pain control [facility] and [facility] and these notes have been requested. She reports that she has been through a series of cervical epidural steroid injections with some relief of her cervical neck pain. She typically has radiation into the left upper extremity affecting the lateral fingers 34 and 5 and has associated diminished grip strength that is also noted to be bilateral. She also develops tension headaches and has been treated for this. She also states that she has chronic low back pain that causes her legs to feel heavy and occasionally weak. She reports having had a previous workup with MRI of the neck and low  back within the past 5-10 years but these are unavailable to me at this time. Furthermore she has had physical therapy and does some home exercises for her neck and back. She's been on Nucynta for her chronic pain and rheumatoid arthritis and has been followed previously by rheumatologic clinic and has just moved her care to current medical clinic for rheumatologic management.  In regards to her cervical neck pain she gets radiation and pain in the left posterior aspect of her elbow radiating over the forearm and into the lateral 3 fingers as mentioned. This is been one of her primary pain complaints. She also has profound tenderness and soreness in the bilateral trapezius muscles which is worse on the left side  She describes her pain condition as being associated with depression and fatigue and inability to concentrate causing numbness and tingling as mentioned in the hand and associated weakness with pain that wakes her up at night with the quality pain described as aching agonizing and annoying intermittent and occasionally sharp and shooting both in the left upper shoulder trapezius area with radiation in the hand and occasionally in the low back.  History Loretta Blankenship has a past medical history of Allergy (0717/17); Arthritis; Arthritis, rheumatoid (HCC); Asthma; Collagen vascular disease (HCC); Depression; GERD (gastroesophageal reflux disease); Hypertension (04/09/2016); and Neuromuscular disorder (HCC) (2010).   She has a past surgical history that includes Laparoscopic oopherectomy (Left, 01/2015).   Her family history includes Arthritis/Rheumatoid in her father; Heart disease in her father and mother; Skin cancer in her father.She reports that she has  never smoked. She has never used smokeless tobacco. She reports that she drinks alcohol. She reports that she does not use drugs.  No results found for this or any previous visit.  ToxAssure Select 13  Date Value Ref Range Status  06/29/2016 FINAL   Final    Comment:    ==================================================================== TOXASSURE SELECT 13 (MW) ==================================================================== Test                             Result       Flag       Units Drug Present not Declared for Prescription Verification   Amphetamine                    332          UNEXPECTED ng/mg creat    Amphetamine is available as a schedule II prescription drug. ==================================================================== Test                      Result    Flag   Units      Ref Range   Creatinine              145              mg/dL      >=17 ==================================================================== Declared Medications:  The flagging and interpretation on this report are based on the  following declared medications.  Unexpected results may arise from  inaccuracies in the declared medications.  **Note: The testing scope of this panel does not include following  reported medications:  Diclofenac (Voltaren)  Duloxetine (Cymbalta)  Etonogestrel (Nexplanon)  Methotrexate  Naproxen  Prednisone (Deltasone)  Tizanidine (Zanaflex)  Tocilizumab  Topiramate (Topamax) ==================================================================== For clinical consultation, please call [phone]. ====================================================================     Outpatient Medications Prior to Visit  Medication Sig Dispense Refill  . cholecalciferol (VITAMIN D) 1000 units tablet Take 5,000 Units by mouth.    . Clobetasol Prop Oint-Coal Tar (CLOBETAPLUS OINTMENT EX) Apply topically as needed.    . diclofenac sodium (VOLTAREN) 1 % GEL Apply topically 4 (four) times daily.    . DULoxetine (CYMBALTA) 60 MG capsule Take 1 capsule (60 mg total) by mouth daily. 90 capsule 1  . etonogestrel (NEXPLANON) 68 MG IMPL implant 1 each by Subdermal route once.    . fluticasone (FLONASE) 50 MCG/ACT nasal spray  Place 2 sprays into both nostrils daily. 16 g 2  . ibuprofen (ADVIL,MOTRIN) 200 MG tablet Take 600 mg by mouth every 6 (six) hours as needed.    Marland Kitchen lisinopril (PRINIVIL,ZESTRIL) 10 MG tablet Take 1 tablet (10 mg total) by mouth daily. 90 tablet 3  . Methotrexate, PF, 7.5 MG/0.15ML SOAJ Inject into the skin once a week.    Marland Kitchen omeprazole (PRILOSEC) 20 MG capsule Take 20 mg by mouth 2 (two) times daily before a meal.     . predniSONE (DELTASONE) 10 MG tablet Take 10 mg by mouth daily with breakfast. Only uses for arthritis flares    . Tocilizumab (ACTEMRA) 162 MG/0.9ML SOSY Inject into the skin once a week.    . topiramate (TOPAMAX) 50 MG tablet Take 1 tablet (50 mg total) by mouth 2 (two) times daily. 60 tablet 1  . traMADol (ULTRAM) 50 MG tablet Take 100 mg by mouth 3 (three) times daily.  0  . tapentadol (NUCYNTA) 50 MG tablet Take 1 tablet (50 mg total) by mouth 2 (two) times  daily. 60 tablet 0  . dicyclomine (BENTYL) 10 MG capsule Take 1 tab twice daily as needed for cramping, abdominal pain, spasms. (Patient not taking: Reported on 12/22/2016) 60 capsule 1  . tiZANidine (ZANAFLEX) 4 MG capsule Take 1 capsule (4 mg total) by mouth once. 90 capsule 0   Facility-Administered Medications Prior to Visit  Medication Dose Route Frequency Provider Last Rate Last Dose  . dexamethasone (DECADRON) injection 10 mg  10 mg Intra-articular Once Yevette Edwards, MD      . dexamethasone (DECADRON) injection 10 mg  10 mg Other Once Yevette Edwards, MD      . ropivacaine (PF) (NAROPIN) injection 15 mL  15 mL Epidural Once Yevette Edwards, MD      . ropivacaine (PF) 2 mg/ml (0.2%) (NAROPIN) epidural 1 mL  1 mL Epidural Once Yevette Edwards, MD       Lab Results  Component Value Date   WBC 7.2 09/30/2016   HGB 15.6 (H) 09/30/2016   HCT 44.9 09/30/2016   PLT 229.0 09/30/2016   GLUCOSE 106 (H) 09/30/2016   ALT 47 (H) 09/30/2016   AST 22 09/30/2016   NA 136 09/30/2016   K 4.2 09/30/2016   CL 105 09/30/2016    CREATININE 0.86 09/30/2016   BUN 15 09/30/2016   CO2 25 09/30/2016    --------------------------------------------------------------------------------------------------------------------- Patient was never admitted.     ---------------------------------------------------------------------------------------------------------------------- Past Medical History:  Diagnosis Date  . Allergy 0717/17   allergies to cats and dogs  . Arthritis   . Arthritis, rheumatoid (HCC)   . Asthma   . Collagen vascular disease (HCC)   . Depression   . GERD (gastroesophageal reflux disease)   . Hypertension 04/09/2016  . Neuromuscular disorder (HCC) 2010   fibromyalgia    Past Surgical History:  Procedure Laterality Date  . LAPAROSCOPIC OOPHERECTOMY Left 01/2015   dermoid cyst that ruptured the tube    Family History  Problem Relation Age of Onset  . Heart disease Mother   . Heart disease Father   . Arthritis/Rheumatoid Father   . Skin cancer Father     basal cell  . Colon cancer Neg Hx   . Stomach cancer Neg Hx     Social History  Substance Use Topics  . Smoking status: Never Smoker  . Smokeless tobacco: Never Used  . Alcohol use 0.0 oz/week     Comment: Occasional wine, beer    ---------------------------------------------------------------------------------------------------------------------- Social History   Social History  . Marital status: Single    Spouse name: N/A  . Number of children: N/A  . Years of education: N/A   Social History Main Topics  . Smoking status: Never Smoker  . Smokeless tobacco: Never Used  . Alcohol use 0.0 oz/week     Comment: Occasional wine, beer  . Drug use: No  . Sexual activity: Yes   Other Topics Concern  . None   Social History Narrative   Not currently working.    [employer]   Engaged. Planning to get married April 2018 in [place] with family and friends.      Enjoys Retail banker.      Exercise- plans to join [employer].                         Pulse (!) 105   Temp 98.4 F (36.9 C) (Oral)   Resp 18   Ht 5\' 8"  (1.727 m)   Wt 203 lb (  92.1 kg)   SpO2 98%   BMI 30.87 kg/m    BP Readings from Last 3 Encounters:  11/17/16 136/85  10/15/16 127/86  09/30/16 118/80     Wt Readings from Last 3 Encounters:  12/22/16 203 lb (92.1 kg)  11/17/16 201 lb (91.2 kg)  10/15/16 200 lb (90.7 kg)     ----------------------------------------------------------------------------------------------------------------------  ROS Review of Systems  Cardiac: No chest pain or shortness of breath Pulmonary: Asthma bronchitis and snoring Neurologic: As above GI: No constipation   Objective:  Pulse (!) 105   Temp 98.4 F (36.9 C) (Oral)   Resp 18   Ht 5\' 8"  (1.727 m)   Wt 203 lb (92.1 kg)   SpO2 98%   BMI 30.87 kg/m   Physical Exam Patient is alert oriented cooperative compliant Pupils are equally round reactive to light extraocular muscles intact She has a trigger point in the left mid body trapezius and appears to be less tender  Assessment & Plan:   Loretta Blankenship was seen today for neck pain.  Diagnoses and all orders for this visit:  Cervicalgia -     ToxASSURE Select 13 (MW), Urine  Chronic pain syndrome  Other orders -     Discontinue: tapentadol (NUCYNTA) 50 MG tablet; Take 1 tablet (50 mg total) by mouth 2 (two) times daily. -     tapentadol (NUCYNTA) 50 MG tablet; Take 1 tablet (50 mg total) by mouth 2 (two) times daily.     ----------------------------------------------------------------------------------------------------------------------  Problem List Items Addressed This Visit    None    Visit Diagnoses    Cervicalgia    -  Primary   Relevant Orders   ToxASSURE Select 13 (MW), Urine   Chronic pain syndrome          ----------------------------------------------------------------------------------------------------------------------  1. Chronic pain She is  tolerating her Topamax and Nucynta well. We have reviewed the practitioner database and these are appropriate and she is deriving good relief based on the narcotic assessment sheet. She is to return to clinic in 2 months for reevaluation and possible trigger point injection at that time contingent on her symptom status. 2. DDD (degenerative disc disease), lumbar 3. Cervicalgia. Continue physical therapy exercises.  4. Primary osteoarthritis involving multiple joints Continue follow-up with her rheumatologist at [facility].   5. Knee pain, chronic, unspecified laterality  - Ambulatory referral to Orthopedic Surgery    ----------------------------------------------------------------------------------------------------------------------  I am having Loretta Blankenship maintain her Tocilizumab, Methotrexate (PF), etonogestrel, diclofenac sodium, predniSONE, Clobetasol Prop Oint-Coal Tar (CLOBETAPLUS OINTMENT EX), fluticasone, topiramate, DULoxetine, omeprazole, ibuprofen, traMADol, dicyclomine, lisinopril, cholecalciferol, tiZANidine, amphetamine-dextroamphetamine, and tapentadol. We will continue to administer dexamethasone, ropivacaine (PF) 5 mg/mL (0.5%), dexamethasone, and ropivacaine (PF) 2 mg/mL (0.2%).   Meds ordered this encounter  Medications  . amphetamine-dextroamphetamine (ADDERALL) 10 MG tablet    Sig: Take 10 mg by mouth 2 (two) times daily with a meal.  . DISCONTD: tapentadol (NUCYNTA) 50 MG tablet    Sig: Take 1 tablet (50 mg total) by mouth 2 (two) times daily.    Dispense:  60 tablet    Refill:  0  . tapentadol (NUCYNTA) 50 MG tablet    Sig: Take 1 tablet (50 mg total) by mouth 2 (two) times daily.    Dispense:  60 tablet    Refill:  0    Do not fill until [id number]   Follow-up: Return in about 2 months (around 02/19/2017) for procedure, evaluation.   Yevette Edwards, MD  This dictation  was performed utilizing Conservation officer, historic buildings.  Please excuse any  unintentional or mistaken typographical errors as a result of its unedited utilization.

## 2016-12-27 LAB — TOXASSURE SELECT 13 (MW), URINE

## 2017-02-09 ENCOUNTER — Ambulatory Visit: Payer: BLUE CROSS/BLUE SHIELD | Admitting: Anesthesiology

## 2017-05-18 ENCOUNTER — Ambulatory Visit (INDEPENDENT_AMBULATORY_CARE_PROVIDER_SITE_OTHER): Payer: BLUE CROSS/BLUE SHIELD | Admitting: Allergy and Immunology

## 2017-05-18 ENCOUNTER — Encounter: Payer: Self-pay | Admitting: Allergy and Immunology

## 2017-05-18 VITALS — BP 116/80 | HR 76 | Temp 98.3°F | Resp 16 | Ht 67.25 in | Wt 194.7 lb

## 2017-05-18 DIAGNOSIS — H1010 Acute atopic conjunctivitis, unspecified eye: Secondary | ICD-10-CM

## 2017-05-18 DIAGNOSIS — J3089 Other allergic rhinitis: Secondary | ICD-10-CM | POA: Diagnosis not present

## 2017-05-18 DIAGNOSIS — H1045 Other chronic allergic conjunctivitis: Secondary | ICD-10-CM

## 2017-05-18 DIAGNOSIS — Z91018 Allergy to other foods: Secondary | ICD-10-CM | POA: Diagnosis not present

## 2017-05-18 DIAGNOSIS — K219 Gastro-esophageal reflux disease without esophagitis: Secondary | ICD-10-CM | POA: Diagnosis not present

## 2017-05-18 MED ORDER — OMEPRAZOLE 40 MG PO CPDR: 40.0000 mg | DELAYED_RELEASE_CAPSULE | Freq: Every day | ORAL | 5 refills | Status: AC

## 2017-05-18 MED ORDER — MOMETASONE FUROATE 0.1 % EX OINT: TOPICAL_OINTMENT | Freq: Every day | CUTANEOUS | 0 refills | Status: AC

## 2017-05-18 MED ORDER — PIMECROLIMUS 1 % EX CREA: TOPICAL_CREAM | Freq: Two times a day (BID) | CUTANEOUS | 0 refills | Status: DC

## 2017-05-18 MED ORDER — OLOPATADINE HCL 0.7 % OP SOLN: 1.0000 [drp] | Freq: Every day | OPHTHALMIC | 5 refills | Status: AC

## 2017-05-18 MED ORDER — EPINEPHRINE 0.3 MG/0.3ML IJ SOAJ: INTRAMUSCULAR | 1 refills | Status: AC

## 2017-05-18 MED ORDER — MONTELUKAST SODIUM 10 MG PO TABS: 10.0000 mg | ORAL_TABLET | Freq: Every day | ORAL | 5 refills | Status: AC

## 2017-05-18 NOTE — Patient Instructions (Addendum)
  1. Allergen avoidance measures  2. Treat and prevent inflammation:   A. OTC Nasacort/Rhinocort one spray each nostril 3-7 times per week  B. montelukast 10 mg tablet 1 time per day  3. Treat and prevent reflux (LPR):   A. consolidate all caffeine consumption  B. consolidate all tobacco use. Use nicotine substitute  C. consistently use omeprazole 40 mg daily  4. If needed:   A. Elidel followed by mometasone 0.1% ointment one-2 times per day to eczema  B. Auvi-Q, Benadryl, M.D./ER evaluation for allergic reaction  C. OTC antihistamine - Claritin/Allegra/Zyrtec  D. Pazeo - one drop each eye one time per day  5. Use vitamin D and calcium supplementation  6. Obtain a bone density scan  7. Return to clinic in 4 weeks or earlier if problem

## 2017-05-18 NOTE — Progress Notes (Signed)
Dear Rennie Plowman,  Thank you for referring Loretta Blankenship to the [facility] [place] on 05/18/2017.   Below is a summation of this patient's evaluation and recommendations.  Thank you for your referral. I will keep you informed about this patient's response to treatment.   If you have any questions please do not hesitate to contact me.   Sincerely,  Jessica Priest, MD Allergy / Immunology [facility]   ______________________________________________________________________    NEW PATIENT NOTE  Referring Provider: Allegra Grana, FNP Primary Provider: Allegra Grana, FNP Date of office visit: 05/18/2017    Subjective:   Chief Complaint:  Loretta Blankenship (DOB: 1981/03/25) is a 36 y.o. female who presents to the clinic on 05/18/2017 with a chief complaint of Other (Chronic inflammation) .     HPI: Loretta Blankenship presents this clinic in evaluation of several different issues.  First, she apparently has rheumatoid arthritis and is being treated with an immunomodulatory lesion over the course of the past 4 years but still continues to have flareups about 1 time per month for which she will use prednisone with a tapering dose of 20, 15, 10, and 5 mg per course. She has been stuck in this monthly prednisone administration therapy for the past 10 years. She has flareups involving dorsal pain of her feet and as well toe numbness and radial hand numbness and elbow pain. She is scheduled to see a rheumatologist in [place]. She was seeing a rheumatologist previously in [place]. She is wondering if there is a environmental factor that is giving rise to her inflammation. She has started a grain free diet but this has not really helped her to any degree other than to help her lose weight.  Second, she does have issues with sneezing and nose blowing and itchy red watery eyes especially during the spring  and fall especially following outdoors exposure.  Third, she has had problems with "itchy lungs, itchy ears, and itchy throat" when being exposed to roasted pecans and walnuts. She has not consumed any pecans or walnuts but develops this issue when just having aerosolized smell of pecans and walnuts. She can eat almonds and cashews and peanuts with no problem.  Fourth, she has postnasal drip and throat clearing and a tickle stuck in her throat and a little bit of a chronic cough and some intermittent raspy voice of many years duration without any obvious precipitant. She does have reflux disease with epigastric discomfort about twice a week for which she will use omeprazole about twice a week. She has caffeine about 3 times per week and drinks alcohol about 2 times per week.  Fifth, she smokes tobacco. She was a former smoker on a daily basis but now she smokes about twice a week when she goes out socially.  Sixth, she has eczema involving her feet and hands and wrist for which he uses clobetasol on a daily basis and has done so for a long period in time.  Seventh, she has a family history of osteoporosis with her mom suffering from this condition.  Past Medical History:  Diagnosis Date  . Allergy 0717/17   allergies to cats and dogs  . Arthritis   . Arthritis, rheumatoid (HCC)   . Asthma   . Collagen vascular disease (HCC)   . Depression   . GERD (gastroesophageal reflux disease)   . Hypertension 04/09/2016  . Neuromuscular disorder (HCC) 2010  fibromyalgia    Past Surgical History:  Procedure Laterality Date  . LAPAROSCOPIC OOPHERECTOMY Left 01/2015   dermoid cyst that ruptured the tube    Allergies as of 05/18/2017      Reactions   Rituxan [rituximab] Shortness Of Breath   Shuts down her lungs      Medication List      ACTEMRA 162 MG/0.9ML Sosy Generic drug:  Tocilizumab Inject into the skin once a week.   amphetamine-dextroamphetamine 10 MG tablet Commonly known as:   ADDERALL Take 10 mg by mouth 2 (two) times daily with a meal.   cholecalciferol 1000 units tablet Commonly known as:  VITAMIN D Take 5,000 Units by mouth.   CLOBETAPLUS OINTMENT EX Apply topically as needed.   diclofenac sodium 1 % Gel Commonly known as:  VOLTAREN Apply topically 4 (four) times daily.   dicyclomine 10 MG capsule Commonly known as:  BENTYL Take 1 tab twice daily as needed for cramping, abdominal pain, spasms.   DULoxetine 60 MG capsule Commonly known as:  CYMBALTA Take 1 capsule (60 mg total) by mouth daily.   etonogestrel 68 MG Impl implant Commonly known as:  NEXPLANON 1 each by Subdermal route once.   fluticasone 50 MCG/ACT nasal spray Commonly known as:  FLONASE Place 2 sprays into both nostrils daily.   ibuprofen 200 MG tablet Commonly known as:  ADVIL,MOTRIN Take 600 mg by mouth every 6 (six) hours as needed.   lisinopril 10 MG tablet Commonly known as:  PRINIVIL,ZESTRIL Take 1 tablet (10 mg total) by mouth daily.   NAPROXEN PO Take by mouth.   omeprazole 20 MG capsule Commonly known as:  PRILOSEC Take 20 mg by mouth 2 (two) times daily before a meal.   predniSONE 10 MG tablet Commonly known as:  DELTASONE Take 10 mg by mouth daily with breakfast. Only uses for arthritis flares   tapentadol 50 MG tablet Commonly known as:  NUCYNTA Take 1 tablet (50 mg total) by mouth 2 (two) times daily.   tiZANidine 4 MG capsule Commonly known as:  ZANAFLEX Take 1 capsule (4 mg total) by mouth once.   topiramate 50 MG tablet Commonly known as:  TOPAMAX Take 1 tablet (50 mg total) by mouth 2 (two) times daily.       Review of systems negative except as noted in HPI / PMHx or noted below:  Review of Systems  Constitutional: Negative.   HENT: Negative.   Eyes: Negative.   Respiratory: Negative.   Cardiovascular: Negative.   Gastrointestinal: Negative.   Genitourinary: Negative.   Musculoskeletal: Negative.   Skin: Negative.     Neurological: Negative.   Endo/Heme/Allergies: Negative.   Psychiatric/Behavioral: Negative.     Family History  Problem Relation Age of Onset  . Heart disease Mother   . Heart disease Father   . Arthritis/Rheumatoid Father   . Skin cancer Father        basal cell  . Colon cancer Neg Hx   . Stomach cancer Neg Hx     Social History   Social History  . Marital status: Single    Spouse name: N/A  . Number of children: N/A  . Years of education: N/A   Occupational History  . Not on file.   Social History Main Topics  . Smoking status: Never Smoker  . Smokeless tobacco: Never Used  . Alcohol use 0.0 oz/week     Comment: Occasional wine, beer  . Drug use: No  . Sexual activity:  Yes   Other Topics Concern  . Not on file   Social History Narrative   Not currently working.    [employer]   Engaged. Planning to get married April 2018 in [place] with family and friends.      Enjoys Retail banker.      Exercise- plans to join [employer].                         Environmental and Social history  Lives in a house with a dry environment, no animals located inside the household, no carpeting in the bedroom, no plastic on the bed or pillow, intermittently smoking tobacco products,  Objective:   Vitals:   05/18/17 0839  BP: 116/80  Pulse: 76  Resp: 16  Temp: 98.3 F (36.8 C)   Height: 5' 7.25" (170.8 cm) Weight: 194 lb 11.2 oz (88.3 kg)  Physical Exam  Constitutional: She is well-developed, well-nourished, and in no distress.  HENT:  Head: Normocephalic. Head is without right periorbital erythema and without left periorbital erythema.  Right Ear: Tympanic membrane, external ear and ear canal normal.  Left Ear: Tympanic membrane, external ear and ear canal normal.  Nose: Nose normal. No mucosal edema or rhinorrhea.  Mouth/Throat: Oropharynx is clear and moist and mucous membranes are normal. No oropharyngeal exudate.  Eyes: Conjunctivae and lids are  normal. Pupils are equal, round, and reactive to light.  Neck: Trachea normal. No tracheal deviation present. No thyromegaly present.  Cardiovascular: Normal rate, regular rhythm, S1 normal, S2 normal and normal heart sounds.   No murmur heard. Pulmonary/Chest: Effort normal. No stridor. No tachypnea. No respiratory distress. She has no wheezes. She has no rales. She exhibits no tenderness.  Abdominal: Soft. She exhibits no distension and no mass. There is no hepatosplenomegaly. There is no tenderness. There is no rebound and no guarding.  Musculoskeletal: She exhibits no edema or tenderness.  Lymphadenopathy:       Head (right side): No tonsillar adenopathy present.       Head (left side): No tonsillar adenopathy present.    She has no cervical adenopathy.    She has no axillary adenopathy.  Neurological: She is alert. Gait normal.  Skin: Rash (Multiple patches of erythematous indurated slightly scaly skin on wrist, forearm, hands, lower extremities) noted. She is not diaphoretic. No erythema. No pallor. Nails show no clubbing.  Psychiatric: Mood and affect normal.    Diagnostics: Allergy skin tests were performed. She demonstrated hypersensitivity to grasses, trees, weeds, house dust mite, cat, dog, and horse.  Assessment and Plan:    1. Other allergic rhinitis   2. Seasonal allergic conjunctivitis   3. LPRD (laryngopharyngeal reflux disease)   4. Tree nut allergy     1. Allergen avoidance measures  2. Treat and prevent inflammation:   A. OTC Nasacort/Rhinocort one spray each nostril 3-7 times per week  B. montelukast 10 mg tablet 1 time per day  3. Treat and prevent reflux (LPR):   A. consolidate all caffeine consumption  B. consolidate all tobacco use. Use nicotine substitute  C. consistently use omeprazole 40 mg daily  4. If needed:   A. Elidel followed by mometasone 0.1% ointment one-2 times per day to eczema  B. Auvi-Q, Benadryl, M.D./ER evaluation for allergic  reaction  C. OTC antihistamine - Claritin/Allegra/Zyrtec  D. Pazeo - one drop each eye one time per day  5. Use vitamin D and calcium supplementation  6. Obtain a bone  density scan  7. Return to clinic in 4 weeks or earlier if problem  Josalyn has a very active immune system with both atopic disease of her upper airway, conjunctiva, and skin and rheumatoid arthritis. We will have her perform allergen avoidance measures and use anti-inflammatory medications as noted above. In addition, she appears to have a component of LPR which we will address with the therapy noted above and I also had a talk with her today about the need to find a nicotine substitute to replace her tobacco smoke exposure. Because she has had systemic steroids every month for many years we will obtain a bone density scan looking for osteoporosis. I will regroup with her in 4 weeks or earlier if there is a problem.  Jessica Priest, MD [facility] [place]

## 2017-05-19 ENCOUNTER — Encounter: Payer: Self-pay | Admitting: Family

## 2017-05-19 DIAGNOSIS — K219 Gastro-esophageal reflux disease without esophagitis: Secondary | ICD-10-CM | POA: Insufficient documentation

## 2017-06-15 ENCOUNTER — Encounter: Payer: Self-pay | Admitting: Allergy and Immunology

## 2017-06-15 ENCOUNTER — Ambulatory Visit (INDEPENDENT_AMBULATORY_CARE_PROVIDER_SITE_OTHER): Payer: BLUE CROSS/BLUE SHIELD | Admitting: Allergy and Immunology

## 2017-06-15 VITALS — BP 120/70 | HR 84 | Resp 16

## 2017-06-15 DIAGNOSIS — Z91018 Allergy to other foods: Secondary | ICD-10-CM

## 2017-06-15 DIAGNOSIS — J3089 Other allergic rhinitis: Secondary | ICD-10-CM

## 2017-06-15 DIAGNOSIS — Z720 Tobacco use: Secondary | ICD-10-CM | POA: Diagnosis not present

## 2017-06-15 DIAGNOSIS — Z92241 Personal history of systemic steroid therapy: Secondary | ICD-10-CM

## 2017-06-15 DIAGNOSIS — H1045 Other chronic allergic conjunctivitis: Secondary | ICD-10-CM

## 2017-06-15 DIAGNOSIS — L2089 Other atopic dermatitis: Secondary | ICD-10-CM | POA: Diagnosis not present

## 2017-06-15 DIAGNOSIS — K219 Gastro-esophageal reflux disease without esophagitis: Secondary | ICD-10-CM

## 2017-06-15 DIAGNOSIS — H1010 Acute atopic conjunctivitis, unspecified eye: Secondary | ICD-10-CM

## 2017-06-15 MED ORDER — PIMECROLIMUS 1 % EX CREA: TOPICAL_CREAM | Freq: Two times a day (BID) | CUTANEOUS | 0 refills | Status: AC

## 2017-06-15 NOTE — Progress Notes (Signed)
Follow-up Note  Referring Provider: Allegra Grana, FNP Primary Provider: Allegra Grana, FNP Date of Office Visit: 06/15/2017  Subjective:   Loretta Blankenship (DOB: 02/28/1981) is a 36 y.o. female who [facility] the [facility] on 06/15/2017 in re-evaluation of the following:  HPI: Loretta Blankenship returns to this clinic in reevaluation of her allergic rhinitis and conjunctivitis and LPR and eczema and history of tree nut allergy. I last saw her in this clinic during her initial evaluation of 05/18/2017 at which point in time we assigned a plan to address each issue.  Her upper airways are doing much better at this point in time. She is very pleased with the effects she is receiving with consistent use of the nasal steroid and a leukotriene modifier.  Her skin has not improved as much especially on her hands and feet. Because the Elidel cost $65 she did not get this prescription filled and she has been relying on the use of topical mometasone about 5 times a day to areas of eczema.  She has noticed that her postnasal drip and throat clearing and a tickle stuck in her throat and cough is much better at this point in time. She still continues to drink caffeine however and she only uses her omeprazole several times per week.  She still continues to smoke tobacco about twice a week.  She is waiting for her appointment at [facility] rheumatology to address her rheumatoid arthritis that still appears to be somewhat active even in the face of her current immunosuppression which includes Tocilizumab.  Allergies as of 06/15/2017      Reactions   Rituxan [rituximab] Shortness Of Breath   Shuts down her lungs      Medication List      ACTEMRA 162 MG/0.9ML Sosy Generic drug:  Tocilizumab Inject into the skin once a week.   amphetamine-dextroamphetamine 10 MG tablet Commonly known as:  ADDERALL Take 10 mg by mouth 2 (two) times daily with a meal.   cholecalciferol 1000 units  tablet Commonly known as:  VITAMIN D Take 5,000 Units by mouth.   diclofenac sodium 1 % Gel Commonly known as:  VOLTAREN Apply topically 4 (four) times daily.   DULoxetine 60 MG capsule Commonly known as:  CYMBALTA Take 1 capsule (60 mg total) by mouth daily.   EPINEPHrine 0.3 mg/0.3 mL Soaj injection Commonly known as:  AUVI-Q Use as directed for severe allergic reaction   etonogestrel 68 MG Impl implant Commonly known as:  NEXPLANON 1 each by Subdermal route once.   fluticasone 50 MCG/ACT nasal spray Commonly known as:  FLONASE Place 2 sprays into both nostrils daily.   ibuprofen 200 MG tablet Commonly known as:  ADVIL,MOTRIN Take 600 mg by mouth every 6 (six) hours as needed.   lisinopril 10 MG tablet Commonly known as:  PRINIVIL,ZESTRIL Take 1 tablet (10 mg total) by mouth daily.   mometasone 0.1 % ointment Commonly known as:  ELOCON Apply topically daily.   montelukast 10 MG tablet Commonly known as:  SINGULAIR Take 1 tablet (10 mg total) by mouth at bedtime.   NAPROXEN PO Take by mouth.   Olopatadine HCl 0.7 % Soln Commonly known as:  PAZEO Place 1 drop into both eyes daily.   omeprazole 40 MG capsule Commonly known as:  PRILOSEC Take 1 capsule (40 mg total) by mouth daily.   predniSONE 10 MG tablet Commonly known as:  DELTASONE Take 10 mg by mouth daily with breakfast.  Only uses for arthritis flares   tapentadol 50 MG tablet Commonly known as:  NUCYNTA Take 1 tablet (50 mg total) by mouth 2 (two) times daily.   tiZANidine 4 MG capsule Commonly known as:  ZANAFLEX Take 1 capsule (4 mg total) by mouth once.   topiramate 50 MG tablet Commonly known as:  TOPAMAX Take 1 tablet (50 mg total) by mouth 2 (two) times daily.       Past Medical History:  Diagnosis Date  . Allergy 0717/17   allergies to cats and dogs  . Arthritis   . Arthritis, rheumatoid (HCC)   . Asthma   . Collagen vascular disease (HCC)   . Depression   . GERD  (gastroesophageal reflux disease)   . Hypertension 04/09/2016  . Neuromuscular disorder (HCC) 2010   fibromyalgia    Past Surgical History:  Procedure Laterality Date  . LAPAROSCOPIC OOPHERECTOMY Left 01/2015   dermoid cyst that ruptured the tube    Review of systems negative except as noted in HPI / PMHx or noted below:  Review of Systems  Constitutional: Negative.   HENT: Negative.   Eyes: Negative.   Respiratory: Negative.   Cardiovascular: Negative.   Gastrointestinal: Negative.   Genitourinary: Negative.   Musculoskeletal: Negative.   Skin: Negative.   Neurological: Negative.   Endo/Heme/Allergies: Negative.   Psychiatric/Behavioral: Negative.      Objective:   Vitals:   06/15/17 1037  BP: 120/70  Pulse: 84  Resp: 16          Physical Exam  Constitutional: She is well-developed, well-nourished, and in no distress.  HENT:  Head: Normocephalic.  Right Ear: Tympanic membrane, external ear and ear canal normal.  Left Ear: Tympanic membrane, external ear and ear canal normal.  Nose: Nose normal. No mucosal edema or rhinorrhea.  Mouth/Throat: Uvula is midline, oropharynx is clear and moist and mucous membranes are normal. No oropharyngeal exudate.  Eyes: Conjunctivae are normal.  Neck: Trachea normal. No tracheal tenderness present. No tracheal deviation present. No thyromegaly present.  Cardiovascular: Normal rate, regular rhythm, S1 normal, S2 normal and normal heart sounds.   No murmur heard. Pulmonary/Chest: Breath sounds normal. No stridor. No respiratory distress. She has no wheezes. She has no rales.  Musculoskeletal: She exhibits no edema.  Lymphadenopathy:       Head (right side): No tonsillar adenopathy present.       Head (left side): No tonsillar adenopathy present.    She has no cervical adenopathy.  Neurological: She is alert. Gait normal.  Skin: Rash (lichenified scaly slightly erythematous palmar surface of hands and sole and lateral aspect of  feet.) noted. She is not diaphoretic. No erythema. Nails show no clubbing.  Psychiatric: Mood and affect normal.    Diagnostics: none   Assessment and Plan:   1. Other allergic rhinitis   2. Seasonal allergic conjunctivitis   3. Other atopic dermatitis   4. Tree nut allergy   5. LPRD (laryngopharyngeal reflux disease)   6. Tobacco use   7. History of recent steroid use     1. Continue to perform Allergen avoidance measures  2. Continue to Treat and prevent inflammation:   A. Nasacort/Rhinocort/Flonase one spray each nostril 3-7 times per week  B. montelukast 10 mg tablet 1 time per day  3. Continue to Treat and prevent reflux (LPR):   A. consolidate all caffeine consumption  B. consolidate all tobacco use. Use nicotine substitute  C. consistently use omeprazole 40 mg daily  4.  If needed:   A. Elidel then mometasone 0.1% ointment 1-2 times per day to eczema  B. Auvi-Q, Benadryl, M.D./ER evaluation for allergic reaction  C. OTC antihistamine - Claritin/Allegra/Zyrtec  D. Pazeo - one drop each eye one time per day  5. Use vitamin D and calcium supplementation  6. Obtain a bone density scan  7. Return to clinic in 12 weeks or earlier if problem  Sanjuana appears to be better in some regards especially involving her upper airway and her LPR. Her skin is still an active issue and I have encouraged her to use a combination of a calcineurin inhibitor and a topical steroid as noted above. I also made some suggestions about her tobacco use and her caffeine use and she will continue to treat reflux as noted above. I will obtain a bone density scan which I requested during her last visit but for some reason was not completed in investigation of osteoporosis especially given her rather extensive systemic steroid use in the treatment of her rheumatoid arthritis in the past. I will see her back in this clinic in 12 weeks or earlier if there is a problem.  Laurette Schimke, MD Allergy /  Immunology [facility]

## 2017-06-15 NOTE — Patient Instructions (Addendum)
  1. Continue to perform Allergen avoidance measures  2. Continue to Treat and prevent inflammation:   A. Nasacort/Rhinocort/Flonase one spray each nostril 3-7 times per week  B. montelukast 10 mg tablet 1 time per day  3. Continue to Treat and prevent reflux (LPR):   A. consolidate all caffeine consumption  B. consolidate all tobacco use. Use nicotine substitute  C. consistently use omeprazole 40 mg daily  4. If needed:   A. Elidel then mometasone 0.1% ointment 1-2 times per day to eczema  B. Auvi-Q, Benadryl, M.D./ER evaluation for allergic reaction  C. OTC antihistamine - Claritin/Allegra/Zyrtec  D. Pazeo - one drop each eye one time per day  5. Use vitamin D and calcium supplementation  6. Obtain a bone density scan  7. Return to clinic in 12 weeks or earlier if problem

## 2017-06-24 ENCOUNTER — Ambulatory Visit
Admission: RE | Admit: 2017-06-24 | Discharge: 2017-06-24 | Disposition: A | Discharge status: Home / Self Care | Payer: BLUE CROSS/BLUE SHIELD | Source: Ambulatory Visit | Attending: Allergy and Immunology | Admitting: Allergy and Immunology

## 2017-11-10 ENCOUNTER — Other Ambulatory Visit: Payer: Self-pay | Admitting: Physical Medicine and Rehabilitation

## 2017-11-10 ENCOUNTER — Ambulatory Visit
Admission: RE | Admit: 2017-11-10 | Discharge: 2017-11-10 | Disposition: A | Discharge status: Home / Self Care | Payer: BLUE CROSS/BLUE SHIELD | Source: Ambulatory Visit | Attending: Physical Medicine and Rehabilitation | Admitting: Physical Medicine and Rehabilitation

## 2017-11-10 DIAGNOSIS — R52 Pain, unspecified: Secondary | ICD-10-CM

## 2017-11-10 DIAGNOSIS — W19XXXA Unspecified fall, initial encounter: Secondary | ICD-10-CM

## 2019-01-19 IMAGING — CR DG CERVICAL SPINE COMPLETE 4+V
5 series · 5 of 5 positions shown · non-contrast
Comparison: None.

CLINICAL DATA: Chronic neck pain.

EXAM:
CERVICAL SPINE - COMPLETE 4+ VIEW

[w cervical spine lat]
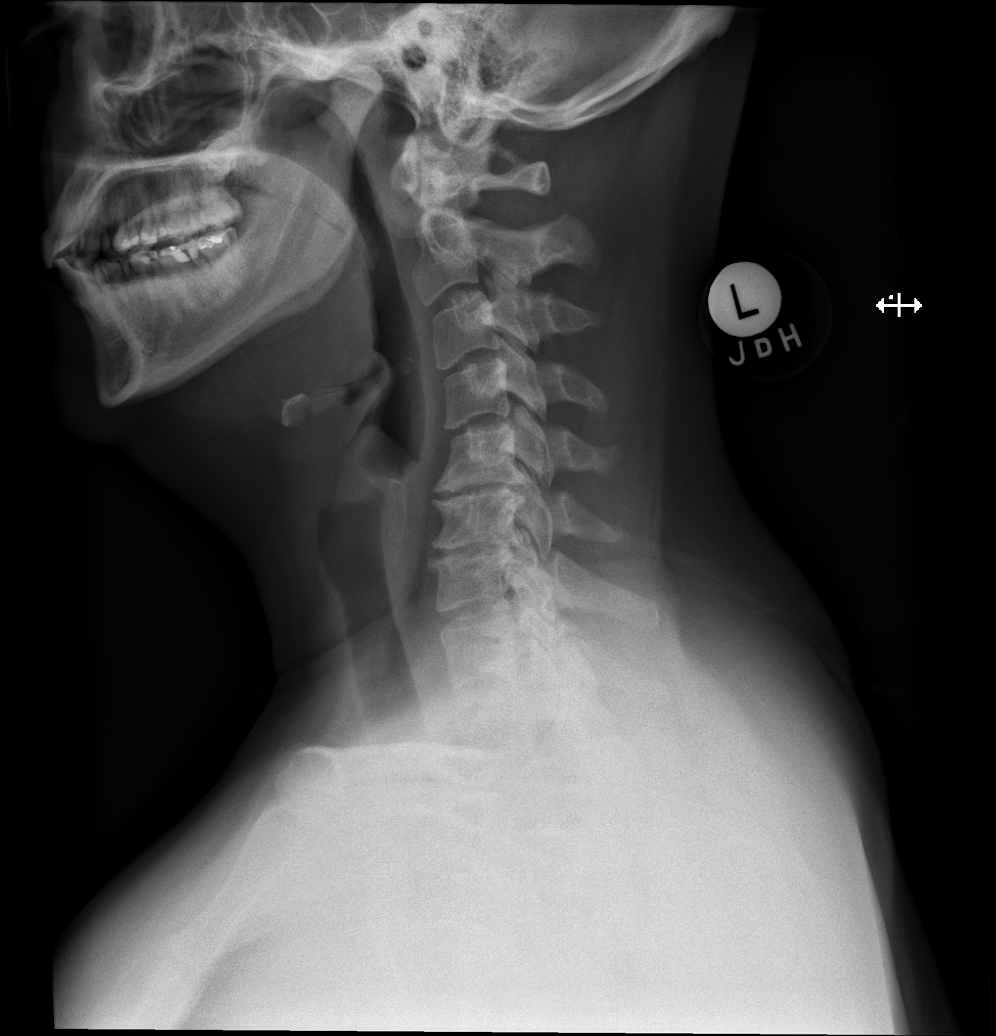

[w cervical spine ap_obl (1 of 2)]
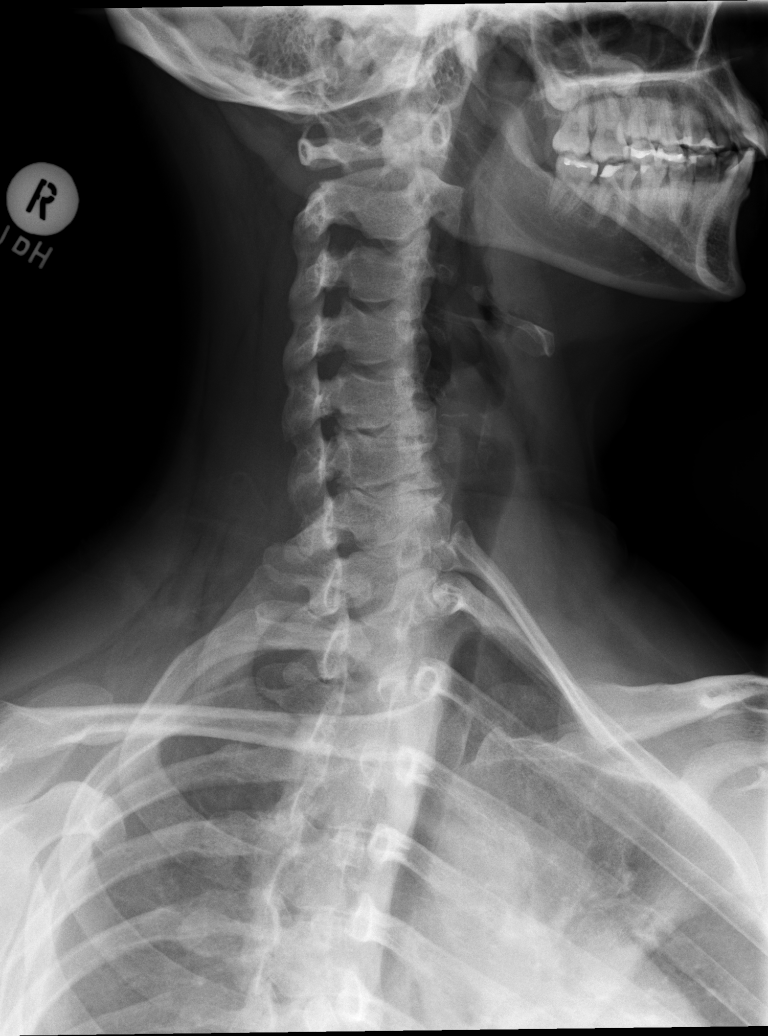

[w cervical spine ap_obl (2 of 2)]
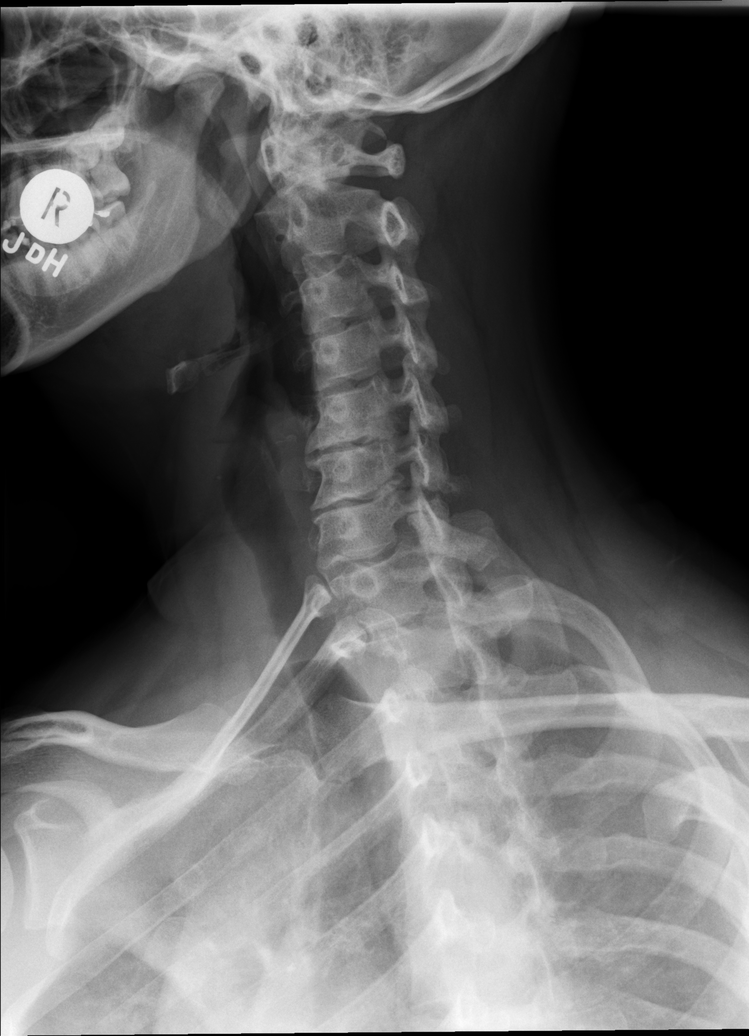

[w cervical spine ap]
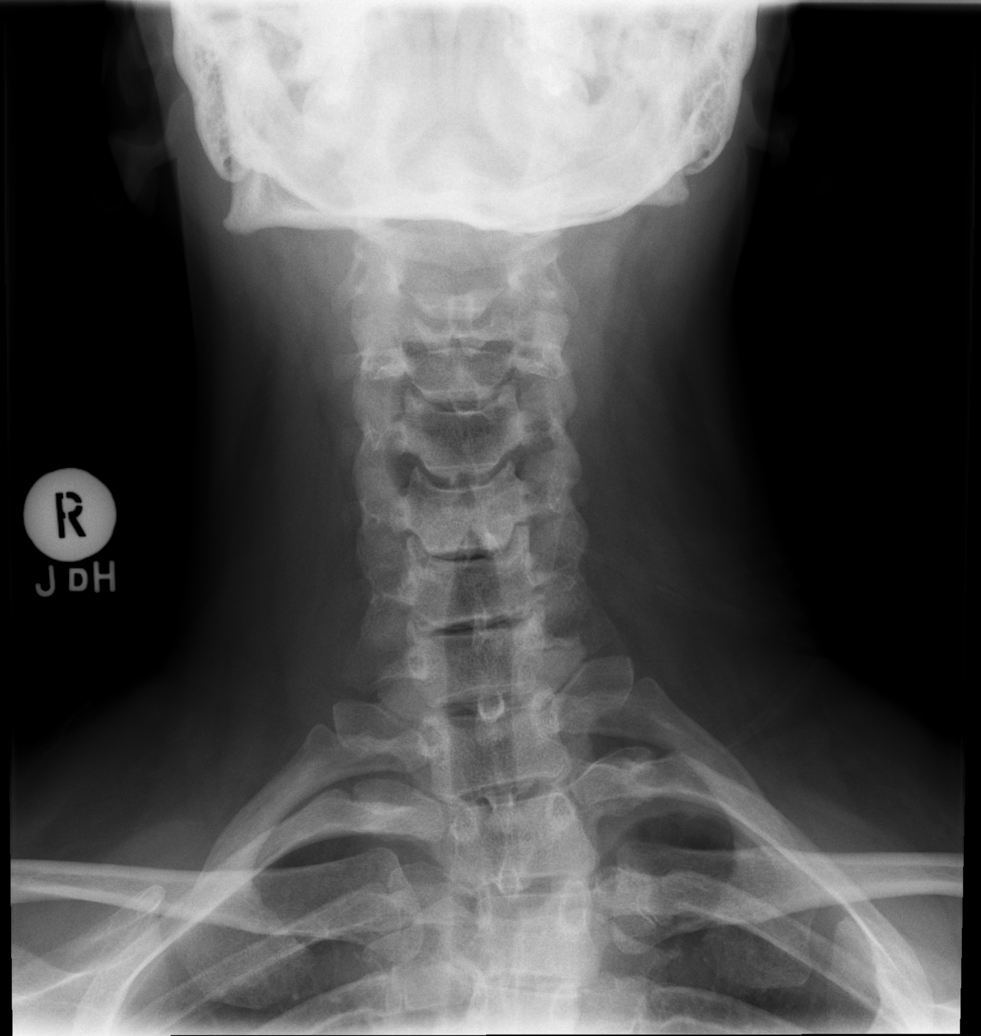

[w cervical spine odontoid]
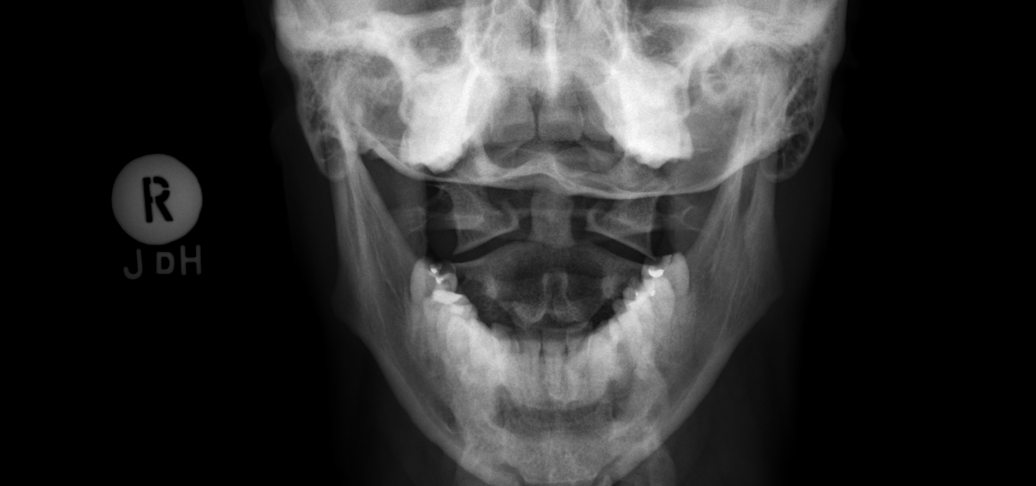

[5 of 5 positions shown; findings below may reference images not displayed]

FINDINGS: There is no evidence of cervical spine fracture or prevertebral soft
tissue swelling. Reversal of cervical lordosis. Multilevel arthritic
changes, worse at C5-C6 and C6-C7, with disc space narrowing,
endplate remodeling and osteophyte formation. Mild posterior facet
arthropathy. Associated Luschka joint arthropathy. There is a
resultant mild stenosis of the bilateral neural foramina at C5-C6
and C6-C7.
IMPRESSION: Multilevel arthritic changes, most severe at C5-C6 and C6-C7.

## 2022-08-31 ENCOUNTER — Other Ambulatory Visit: Payer: Self-pay | Admitting: Orthopaedic Surgery

## 2022-08-31 DIAGNOSIS — M25562 Pain in left knee: Secondary | ICD-10-CM

## 2022-09-08 ENCOUNTER — Ambulatory Visit
Admission: RE | Admit: 2022-09-08 | Discharge: 2022-09-08 | Disposition: A | Discharge status: Home / Self Care | Payer: BC Managed Care – PPO | Source: Ambulatory Visit | Attending: Orthopaedic Surgery | Admitting: Orthopaedic Surgery

## 2022-09-08 DIAGNOSIS — M25562 Pain in left knee: Secondary | ICD-10-CM
